# Patient Record
Sex: Male | Born: 1960 | Hispanic: No | Marital: Married | Smoking: Never smoker
Health system: Southern US, Community
[De-identification: ages and names within clinical notes are randomized; demographics above are authoritative.]

## PROBLEM LIST (undated history)

## (undated) DIAGNOSIS — T7840XA Allergy, unspecified, initial encounter: Secondary | ICD-10-CM

## (undated) DIAGNOSIS — E119 Type 2 diabetes mellitus without complications: Secondary | ICD-10-CM

## (undated) HISTORY — DX: Type 2 diabetes mellitus without complications: E11.9

## (undated) HISTORY — DX: Allergy, unspecified, initial encounter: T78.40XA

---

## 2003-09-05 ENCOUNTER — Ambulatory Visit (HOSPITAL_COMMUNITY): Admission: RE | Admit: 2003-09-05 | Discharge: 2003-09-05 | Payer: Self-pay | Admitting: Internal Medicine

## 2003-10-10 ENCOUNTER — Ambulatory Visit: Payer: Self-pay | Admitting: Nurse Practitioner

## 2003-10-18 ENCOUNTER — Ambulatory Visit: Payer: Self-pay | Admitting: Nurse Practitioner

## 2004-04-26 ENCOUNTER — Ambulatory Visit: Payer: Self-pay | Admitting: *Deleted

## 2004-04-26 ENCOUNTER — Ambulatory Visit: Payer: Self-pay | Admitting: Nurse Practitioner

## 2008-11-18 ENCOUNTER — Encounter: Admission: RE | Admit: 2008-11-18 | Discharge: 2008-11-18 | Payer: Self-pay | Admitting: Specialist

## 2011-06-26 ENCOUNTER — Other Ambulatory Visit: Payer: Self-pay | Admitting: Gastroenterology

## 2011-07-04 ENCOUNTER — Ambulatory Visit
Admission: RE | Admit: 2011-07-04 | Discharge: 2011-07-04 | Disposition: A | Discharge status: Home / Self Care | Payer: BC Managed Care – PPO | Source: Ambulatory Visit | Attending: Gastroenterology | Admitting: Gastroenterology

## 2012-06-08 IMAGING — US US ABDOMEN COMPLETE
1 series · 13 of 25 positions shown · non-contrast
Comparison: None.

CLINICAL DATA: Elevated liver function tests, diabetes

COMPLETE ABDOMINAL ULTRASOUND

[Series 1: us abdomen complete · 0.32mm/px · 13 of 81 slices shown]
[im 1/81]
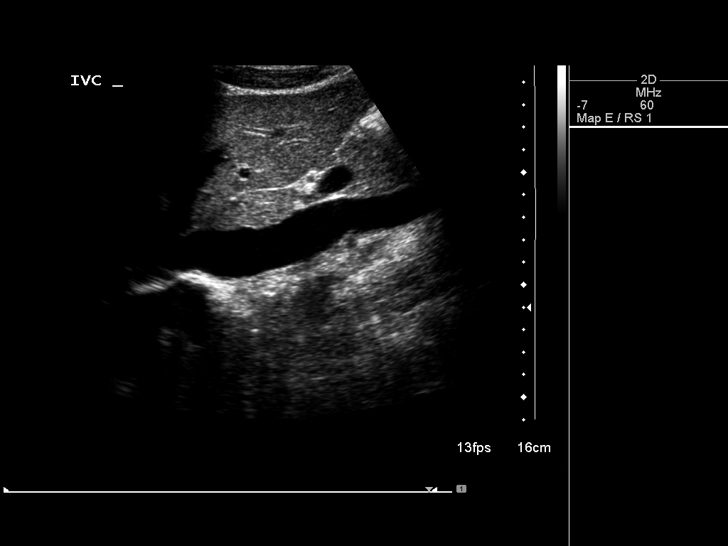
[im 7/81]
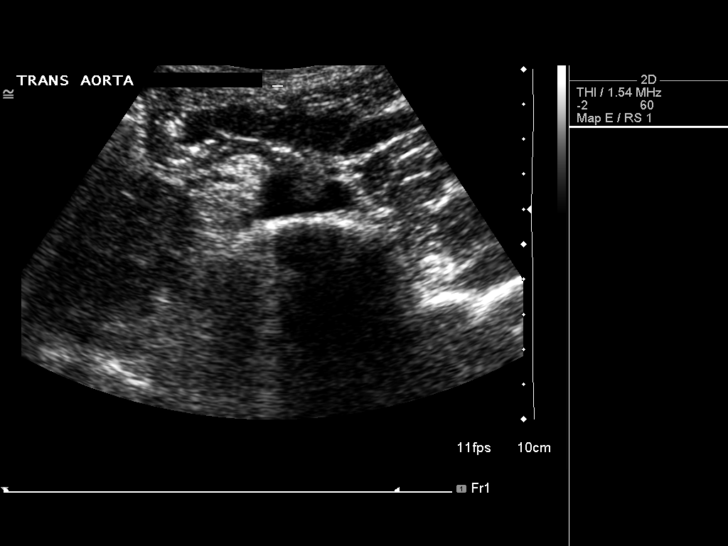
[im 14/81]
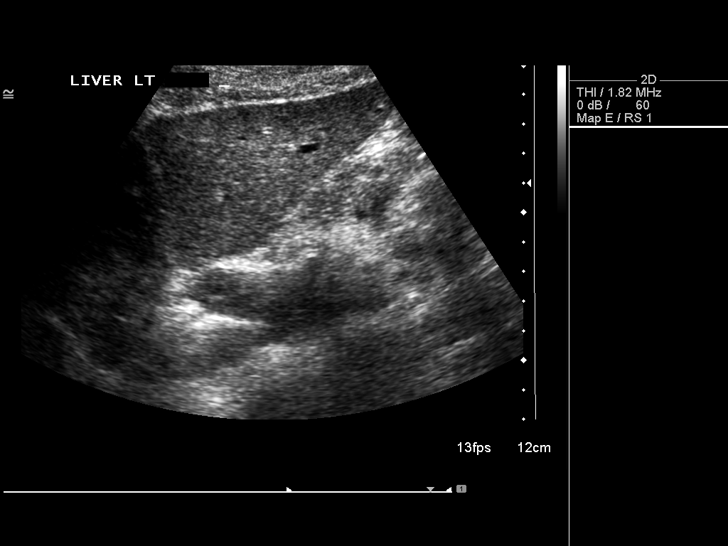
[im 21/81]
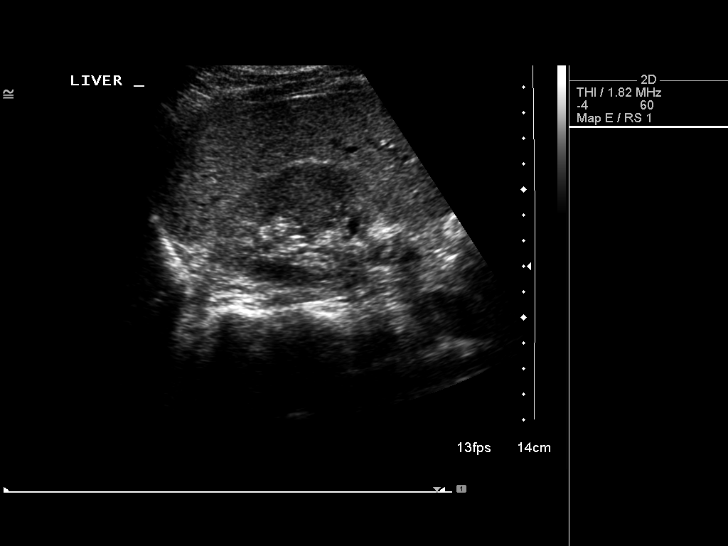
[im 27/81]
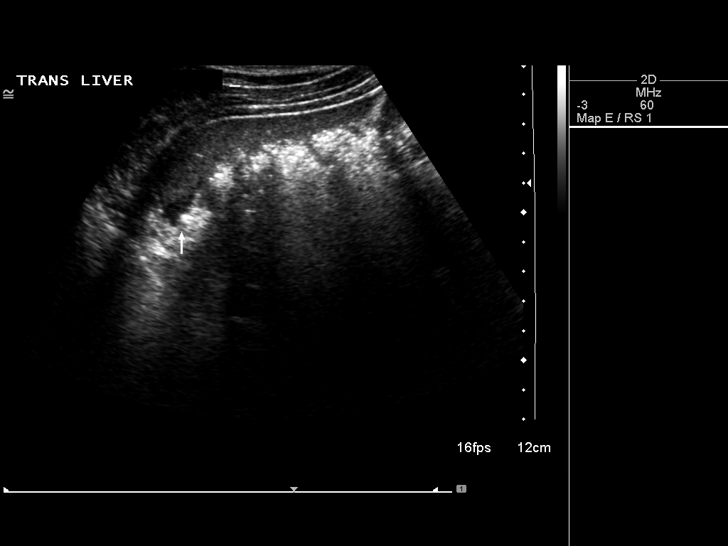
[im 34/81]
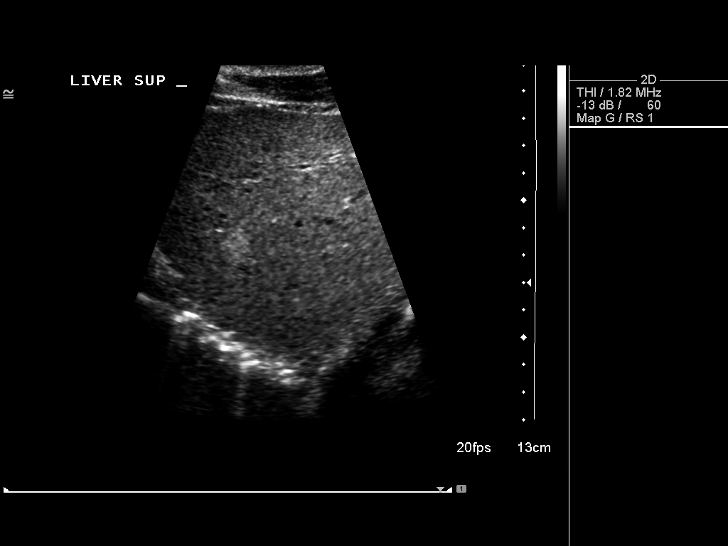
[im 41/81]
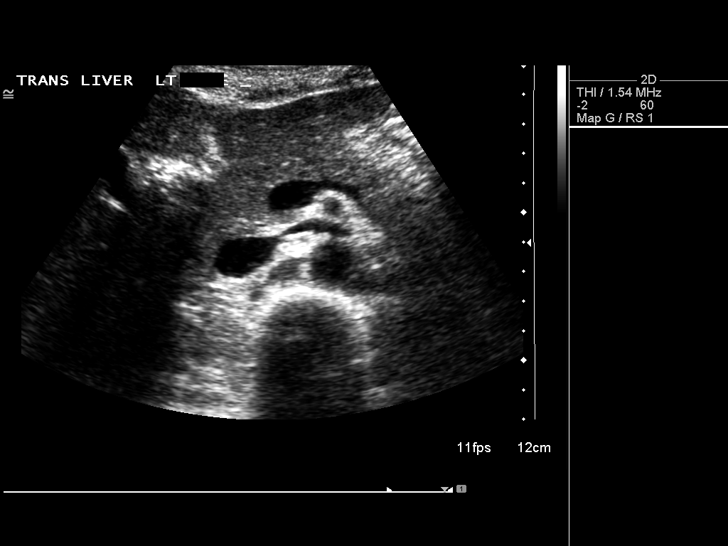
[im 47/81]
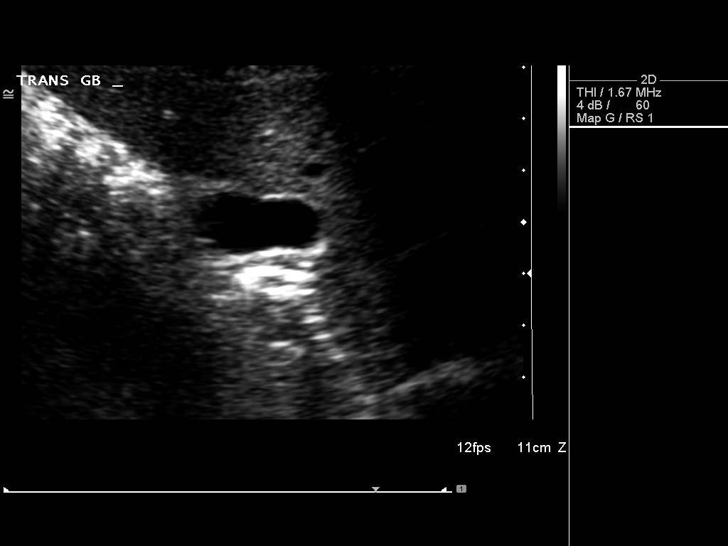
[im 54/81]
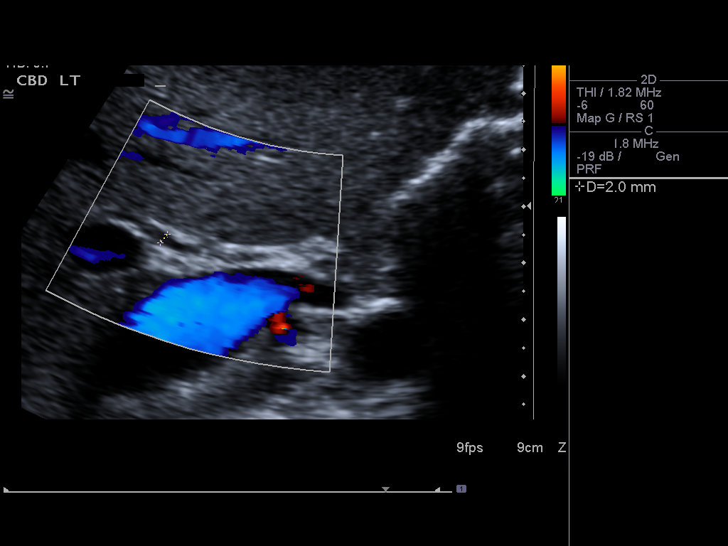
[im 61/81]
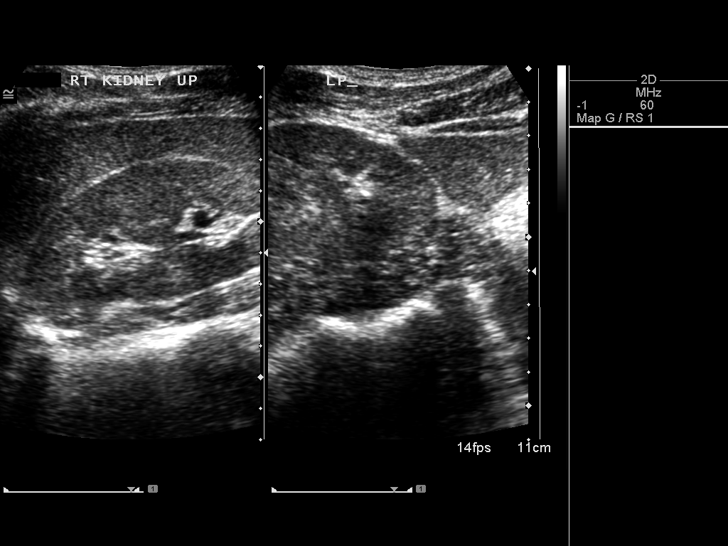
[im 67/81]
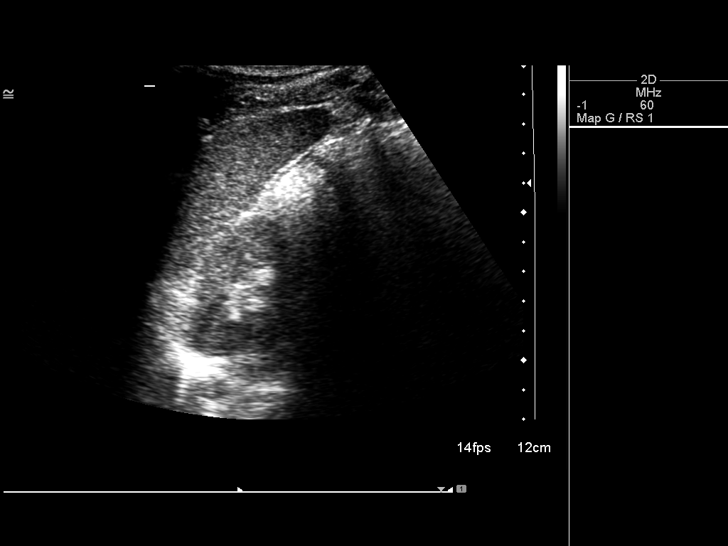
[im 74/81]
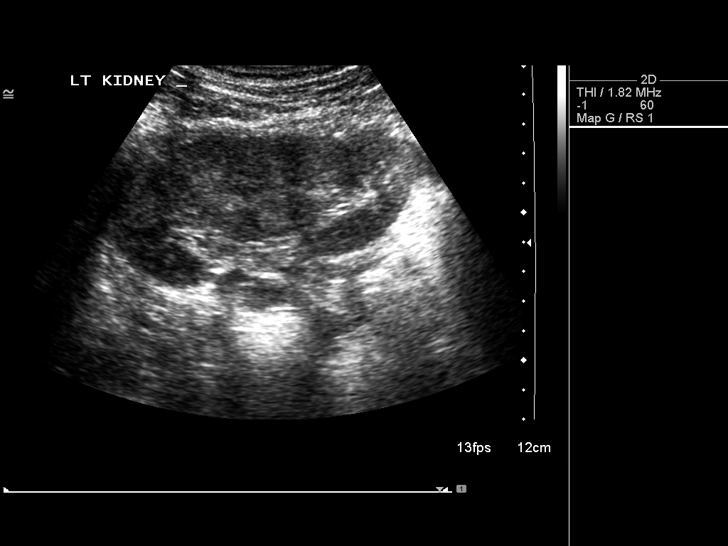
[im 81/81]
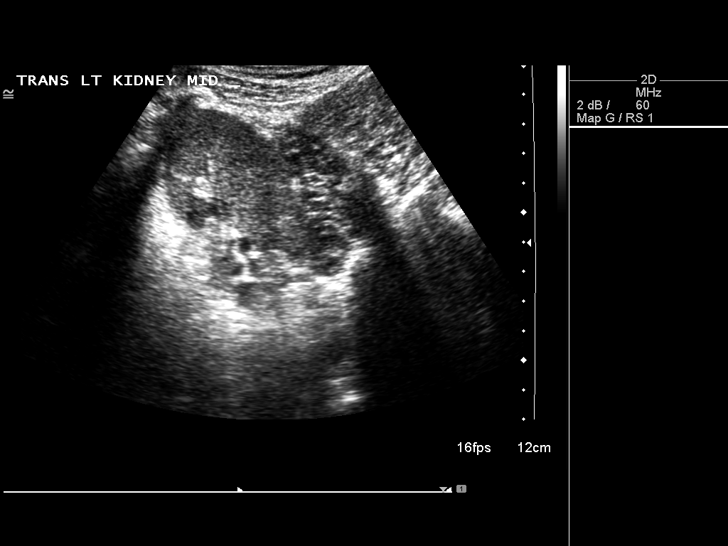

[13 of 25 positions shown; findings below may reference images not displayed]

FINDINGS: Gallbladder:  The gallbladder is visualized and no gallstones are
noted.  There is no pain over the gallbladder with compression.

Common bile duct:  The common bile duct is normal measuring 2.9 mm
in diameter.

Liver:  The liver has a normal echogenic pattern although there is
a small hyper echogenic focus in the right lobe base consistent
with a small hemangioma of 1.6 cm in maximum diameter.  No ductal
dilatation is seen.  There is a tiny amount of fluid along the
inferior aspect of the liver.

IVC:  Appears normal.

Pancreas:  No focal abnormality seen.

Spleen:  The spleen is normal measuring 8.7 cm sagittally.

Right Kidney:  No hydronephrosis is seen.  The right kidney
measures 9.6 cm sagittally.

Left Kidney:  No hydronephrosis is noted.  The left kidney measures
10.8 cm.

Abdominal aorta:  The abdominal aorta is normal in caliber with
atheromatous change present.
IMPRESSION: 1.  No gallstones.  No ductal dilatation.
2.  1.6 cm hemangioma in the right lobe of liver.
3.  Tiny amount of fluid adjacent to the inferior aspect of the
liver.

## 2013-02-26 ENCOUNTER — Ambulatory Visit: Payer: BC Managed Care – PPO

## 2013-05-17 ENCOUNTER — Ambulatory Visit: Payer: BC Managed Care – PPO | Admitting: Emergency Medicine

## 2013-05-17 VITALS — BP 144/90 | HR 60 | Temp 98.0°F | Resp 16 | Ht 63.5 in | Wt 122.2 lb

## 2013-05-17 DIAGNOSIS — J209 Acute bronchitis, unspecified: Secondary | ICD-10-CM

## 2013-05-17 DIAGNOSIS — J018 Other acute sinusitis: Secondary | ICD-10-CM

## 2013-05-17 MED ORDER — PROMETHAZINE-CODEINE 6.25-10 MG/5ML PO SYRP: 5.0000 mL | ORAL_SOLUTION | Freq: Four times a day (QID) | ORAL | Status: DC | PRN

## 2013-05-17 MED ORDER — PSEUDOEPHEDRINE-GUAIFENESIN ER 60-600 MG PO TB12: 1.0000 | ORAL_TABLET | Freq: Two times a day (BID) | ORAL | Status: DC

## 2013-05-17 MED ORDER — AMOXICILLIN-POT CLAVULANATE 875-125 MG PO TABS: 1.0000 | ORAL_TABLET | Freq: Two times a day (BID) | ORAL | Status: DC

## 2013-05-17 NOTE — Patient Instructions (Signed)

## 2013-05-17 NOTE — Progress Notes (Signed)
[facility] [address], [place] [address] [phone]  Date:  05/17/2013   Name:  Erik Grimes   DOB:  08-22-1960   MRN:  [id number]  PCP:  No PCP Per Patient    Chief Complaint: Cough and Allergies   History of Present Illness:  Erik Omsugustine Reigel is a 53 y.o. very pleasant male patient who presents with the following:  Ill for over a week with nasal congestion and mucopurulent drainage and a post nasal drip.  Has a sore throat.  Cough productive of mucopurulent sputum.  No fever or chills.  No wheezing or shortness of breath.  No nausea or vomiting.  No stool change or rash.  Has malaise and fatigue.  No improvement with over the counter medications or other home remedies. Denies other complaint or health concern today.   There are no active problems to display for this patient.   Past Medical History  Diagnosis Date  . Allergy   . Diabetes mellitus without complication     No past surgical history on file.  History  Substance Use Topics  . Smoking status: Never Smoker   . Smokeless tobacco: Not on file  . Alcohol Use: No    No family history on file.  No Known Allergies  Medication list has been reviewed and updated.  No current outpatient prescriptions on file prior to visit.   No current facility-administered medications on file prior to visit.    Review of Systems:  As per HPI, otherwise negative.    Physical Examination: Filed Vitals:   05/17/13 1221  BP: 144/90  Pulse: 60  Temp: 98 F (36.7 C)  Resp: 16   Filed Vitals:   05/17/13 1221  Height: 5' 3.5" (1.613 m)  Weight: 122 lb 3.2 oz (55.43 kg)   Body mass index is 21.3 kg/(m^2). Ideal Body Weight: Weight in (lb) to have BMI = 25: 143.1  GEN: WDWN, NAD, Non-toxic, A & O x 3 HEENT: Atraumatic, Normocephalic. Neck supple. No masses, No LAD. Ears and Nose: No external deformity. CV: RRR, No M/G/R. No JVD. No thrill. No extra heart sounds. PULM: CTA B, no  wheezes, crackles, rhonchi. No retractions. No resp. distress. No accessory muscle use. ABD: S, NT, ND, +BS. No rebound. No HSM. EXTR: No c/c/e NEURO Normal gait.  PSYCH: Normally interactive. Conversant. Not depressed or anxious appearing.  Calm demeanor.    Assessment and Plan: Sinusitis Bronchitis augmentin mucinex Phen c cod   Signed,  Phillips OdorJeffery Jeannelle Wiens, MD

## 2014-05-14 ENCOUNTER — Ambulatory Visit (INDEPENDENT_AMBULATORY_CARE_PROVIDER_SITE_OTHER): Payer: BLUE CROSS/BLUE SHIELD | Admitting: Physician Assistant

## 2014-05-14 VITALS — BP 118/70 | HR 70 | Temp 98.3°F | Resp 16 | Ht 63.0 in | Wt 118.2 lb

## 2014-05-14 DIAGNOSIS — R059 Cough, unspecified: Secondary | ICD-10-CM

## 2014-05-14 DIAGNOSIS — R05 Cough: Secondary | ICD-10-CM | POA: Diagnosis not present

## 2014-05-14 DIAGNOSIS — E119 Type 2 diabetes mellitus without complications: Secondary | ICD-10-CM | POA: Insufficient documentation

## 2014-05-14 DIAGNOSIS — I10 Essential (primary) hypertension: Secondary | ICD-10-CM | POA: Insufficient documentation

## 2014-05-14 DIAGNOSIS — J019 Acute sinusitis, unspecified: Secondary | ICD-10-CM

## 2014-05-14 DIAGNOSIS — E785 Hyperlipidemia, unspecified: Secondary | ICD-10-CM | POA: Insufficient documentation

## 2014-05-14 MED ORDER — FLUTICASONE PROPIONATE 50 MCG/ACT NA SUSP: 2.0000 | Freq: Every day | NASAL | Status: DC

## 2014-05-14 MED ORDER — AMOXICILLIN-POT CLAVULANATE 875-125 MG PO TABS: 1.0000 | ORAL_TABLET | Freq: Two times a day (BID) | ORAL | Status: AC

## 2014-05-14 MED ORDER — GUAIFENESIN ER 1200 MG PO TB12: 1.0000 | ORAL_TABLET | Freq: Two times a day (BID) | ORAL | Status: DC | PRN

## 2014-05-14 MED ORDER — HYDROCOD POLST-CHLORPHEN POLST 10-8 MG/5ML PO LQCR: 5.0000 mL | Freq: Two times a day (BID) | ORAL | Status: DC | PRN

## 2014-05-14 NOTE — Patient Instructions (Signed)
Ask the pharmacist for the other prescription from Dr. Concepcion ElkAvbuere. It's called cetirizine (Zyrtec). THat will help with the itching and sneezing and congestion.  Get plenty of rest and drink at least 64 ounces of water daily.

## 2014-05-14 NOTE — Progress Notes (Signed)
Patient ID: Erik Grimes, male    DOB: 01/03/1961, 54 y.o.   MRN: [id number]  PCP: Dorrene GermanAVBUERE,EDWIN A, MD  Subjective:   Chief Complaint  Patient presents with  . Cough  . sneezing  . Itchy Eyes  . Headache    HPI Presents for evaluation of cough, sneezing, headache. Accompanied by his wife.  Symptoms began last week with coughing, sneezing and headache. He contacted his PCP, who couldn't work him in, but sent in Chertussin and Cetirizine for his symptoms, as they were similar to what he had last spring, and those worked then. Chertussin isn't working this time. He isn't taking the Cetirizine.  With coughing, he has pain in the chest and back. Cough is productive of yellow sputum.  Nasal drainage is the same color. Chills first thing in the morning. Denies fever. Some nausea, loss of appetite. No vomiting. No diarrhea.  He was seen here 05/2013 for similar symptoms. He was diagnosed with sinusitis and bronchitis.   Review of Systems Review of Systems  Constitutional: Positive for chills. Negative for fever, diaphoresis and fatigue.  HENT: Positive for congestion, postnasal drip, rhinorrhea, sinus pressure and sneezing. Negative for ear pain, nosebleeds, sore throat and trouble swallowing.   Eyes: Positive for itching. Negative for pain.  Respiratory: Positive for cough. Negative for shortness of breath and wheezing.   Cardiovascular: Positive for chest pain (with coughing). Negative for palpitations.  Gastrointestinal: Positive for nausea. Negative for vomiting and diarrhea.  Musculoskeletal: Positive for back pain (with cough). Negative for myalgias and arthralgias.  Skin: Negative for rash.  Neurological: Positive for headaches. Negative for dizziness, weakness and light-headedness.       Patient Active Problem List   Diagnosis Date Noted  . Diabetes type 2, controlled 05/14/2014  . HTN (hypertension) 05/14/2014  . Hyperlipidemia 05/14/2014     Prior to  Admission medications   Medication Sig Start Date End Date Taking? Authorizing Provider  amLODipine (NORVASC) 5 MG tablet Take 5 mg by mouth daily.   Yes Historical Provider, MD  guaiFENesin-codeine (ROBITUSSIN AC) 100-10 MG/5ML syrup Take 10 mLs by mouth 2 (two) times daily.   Yes Historical Provider, MD  losartan (COZAAR) 100 MG tablet Take 100 mg by mouth daily.   Yes Historical Provider, MD  lovastatin (MEVACOR) 10 MG tablet Take 10 mg by mouth daily.   Yes Historical Provider, MD  metFORMIN (GLUCOPHAGE) 500 MG tablet Take by mouth 2 (two) times daily with a meal.   Yes Historical Provider, MD     No Known Allergies     Objective:  Physical Exam  Physical Exam  Constitutional: He is oriented to person, place, and time. He appears well-developed and well-nourished. No distress.  BP 118/70 mmHg  Pulse 70  Temp(Src) 98.3 F (36.8 C) (Oral)  Resp 16  Ht 5\' 3"  (1.6 m)  Wt 118 lb 4 oz (53.638 kg)  BMI 20.95 kg/m2  SpO2 99%   HENT:  Head: Normocephalic and atraumatic.  Right Ear: External ear normal.  Left Ear: External ear normal.  Nose: Nose normal.  Mouth/Throat: Oropharynx is clear and moist.  Eyes: Conjunctivae and EOM are normal. Pupils are equal, round, and reactive to light. No scleral icterus.  Neck: Neck supple. No thyromegaly present.  Cardiovascular: Normal rate, regular rhythm and normal heart sounds.   Pulmonary/Chest: Effort normal and breath sounds normal.  Lymphadenopathy:    He has no cervical adenopathy.  Neurological: He is alert and oriented to person, place, and  time.  Skin: Skin is warm and dry.  Psychiatric: He has a normal mood and affect. His speech is normal and behavior is normal.           Assessment & Plan:  1. Cough Likely due to post-nasal drainage. No evidence of bronchitis or pneumonia. - chlorpheniramine-HYDROcodone (TUSSIONEX PENNKINETIC ER) 10-8 MG/5ML LQCR; Take 5 mLs by mouth every 12 (twelve) hours as needed for cough (cough).   Dispense: 100 mL; Refill: 0  2. Subacute sinusitis, unspecified location Likely secondary to allergic rhinitis. Encouraged him to pick up the cetirizine prescription sent by his PCP. Rest, fluids. Follow-up with PCP if symptoms worsen/persist. - fluticasone (FLONASE) 50 MCG/ACT nasal spray; Place 2 sprays into both nostrils daily.  Dispense: 16 g; Refill: 12 - Guaifenesin (MUCINEX MAXIMUM STRENGTH) 1200 MG TB12; Take 1 tablet (1,200 mg total) by mouth every 12 (twelve) hours as needed.  Dispense: 14 tablet; Refill: 1 - amoxicillin-clavulanate (AUGMENTIN) 875-125 MG per tablet; Take 1 tablet by mouth 2 (two) times daily.  Dispense: 20 tablet; Refill: 0   Fernande Bras, PA-C Physician Assistant-Certified [facility]

## 2015-03-14 ENCOUNTER — Ambulatory Visit (INDEPENDENT_AMBULATORY_CARE_PROVIDER_SITE_OTHER): Payer: BLUE CROSS/BLUE SHIELD | Admitting: Family Medicine

## 2015-03-14 VITALS — BP 144/88 | HR 70 | Temp 98.4°F | Ht 63.0 in | Wt 120.0 lb

## 2015-03-14 DIAGNOSIS — R05 Cough: Secondary | ICD-10-CM | POA: Diagnosis not present

## 2015-03-14 DIAGNOSIS — J069 Acute upper respiratory infection, unspecified: Secondary | ICD-10-CM | POA: Diagnosis not present

## 2015-03-14 DIAGNOSIS — R059 Cough, unspecified: Secondary | ICD-10-CM

## 2015-03-14 MED ORDER — AMOXICILLIN 875 MG PO TABS: 875.0000 mg | ORAL_TABLET | Freq: Two times a day (BID) | ORAL | Status: DC

## 2015-03-14 MED ORDER — GUAIFENESIN-CODEINE 100-10 MG/5ML PO SYRP: 10.0000 mL | ORAL_SOLUTION | Freq: Every evening | ORAL | Status: DC | PRN

## 2015-03-14 NOTE — Patient Instructions (Signed)
Please take your allergy medication daily for the next 5-7 days (ceterizine) Please restart your flonase nasal spray and use for the next month (two sprays in each nostril at bedtime) Take tylenol, 2 tablets every 8-12 hours as needed for pain, fever Drink enough fluid that your urine is light yellow If you are not better in 4-5 days, you can start antibiotic (amoxicillin) Please come back to clinic if you get worse, run a fever over 101 for more than 24 hours or have any trouble breathing.

## 2015-03-14 NOTE — Progress Notes (Signed)
Subjective:     Patient ID: Erik Grimes, male   DOB: 10-Aug-1960, 55 y.o.   MRN: [id number]  HPI This is a pleasant 55 yo male who presents today with 2 days of cough, nasal congestion, sore throat. Yellow nasal drainage, feels feverish. Feels like it is difficult to breathe through his nose, thinks he hears wheezing. Tired and achy, decreased appetite. Has seasonal allergies, used flonase in the past, none recently.  Had flu shot last week while at PCP visit. He has paperwork with him today. HgA1C 5.7%. Does not check his blood sugars at home.    Past Medical History  Diagnosis Date  . Allergy   . Diabetes mellitus without complication (HCC)    No past surgical history on file. No family history on file. Social History  Substance Use Topics  . Smoking status: Never Smoker   . Smokeless tobacco: Never Used  . Alcohol Use: No    Review of Systems Per HPI    Objective:   Physical Exam  Constitutional: He is oriented to person, place, and time. He appears well-developed and well-nourished. No distress.  HENT:  Head: Normocephalic and atraumatic.  Right Ear: External ear and ear canal normal.  Left Ear: External ear and ear canal normal.  Nose: Mucosal edema and rhinorrhea present. Right sinus exhibits no maxillary sinus tenderness and no frontal sinus tenderness. Left sinus exhibits no maxillary sinus tenderness and no frontal sinus tenderness.  Mouth/Throat: Uvula is midline. No oropharyngeal exudate, posterior oropharyngeal edema or posterior oropharyngeal erythema.  Bilateral TMs opaque. Post nasal drainage.   Eyes: Conjunctivae are normal.  Neck: Normal range of motion. Neck supple.  Cardiovascular: Normal rate, regular rhythm and normal heart sounds.   Pulmonary/Chest: Effort normal and breath sounds normal.  Musculoskeletal: Normal range of motion.  Lymphadenopathy:    He has no cervical adenopathy.  Neurological: He is alert and oriented to person, place, and time.   Skin: Skin is warm and dry. He is not diaphoretic.  Psychiatric: He has a normal mood and affect. His behavior is normal. Judgment and thought content normal.  Vitals reviewed.     BP 144/88 mmHg  Pulse 70  Temp(Src) 98.4 F (36.9 C) (Oral)  Ht  (1.6 m)  Wt 120 lb (54.432 kg)  BMI 21.26 kg/m2  SpO2 99%     Assessment:      1. Upper respiratory infection - suspect viral uri, provided information regarding symptomatic treatment -  Patient Instructions  Please take your allergy medication daily for the next 5-7 days (ceterizine) Please restart your flonase nasal spray and use for the next month (two sprays in each nostril at bedtime) Take tylenol, 2 tablets every 8-12 hours as needed for pain, fever Drink enough fluid that your urine is light yellow If you are not better in 4-5 days, you can start antibiotic (amoxicillin) Please come back to clinic if you get worse, run a fever over 101 for more than 24 hours or have any trouble breathing.   -amoxicillin (AMOXIL) 875 MG tablet; Take 1 tablet (875 mg total) by mouth 2 (two) times daily.  Dispense: 14 tablet; Refill: 0  2. Cough - guaiFENesin-codeine (ROBITUSSIN AC) 100-10 MG/5ML syrup; Take 10 mLs by mouth at bedtime as needed for cough. Reported on 03/14/2015  Dispense: 118 mL; Refill: 0   Olean Ree, FNP-BC  [facility]  03/14/2015 10:17 AM  Plan:

## 2015-05-17 ENCOUNTER — Ambulatory Visit (INDEPENDENT_AMBULATORY_CARE_PROVIDER_SITE_OTHER): Payer: BLUE CROSS/BLUE SHIELD

## 2015-05-17 ENCOUNTER — Ambulatory Visit (INDEPENDENT_AMBULATORY_CARE_PROVIDER_SITE_OTHER): Payer: BLUE CROSS/BLUE SHIELD | Admitting: Urgent Care

## 2015-05-17 VITALS — BP 144/68 | HR 67 | Temp 98.2°F | Resp 16 | Ht 63.0 in | Wt 120.0 lb

## 2015-05-17 DIAGNOSIS — R05 Cough: Secondary | ICD-10-CM

## 2015-05-17 DIAGNOSIS — R0789 Other chest pain: Secondary | ICD-10-CM

## 2015-05-17 DIAGNOSIS — R0602 Shortness of breath: Secondary | ICD-10-CM | POA: Diagnosis not present

## 2015-05-17 DIAGNOSIS — J019 Acute sinusitis, unspecified: Secondary | ICD-10-CM

## 2015-05-17 DIAGNOSIS — R059 Cough, unspecified: Secondary | ICD-10-CM

## 2015-05-17 DIAGNOSIS — J302 Other seasonal allergic rhinitis: Secondary | ICD-10-CM

## 2015-05-17 LAB — POCT CBC
GRANULOCYTE PERCENT: 52.9 % (ref 37–80)
HEMATOCRIT: 42.6 % — AB (ref 43.5–53.7)
Hemoglobin: 14.8 g/dL (ref 14.1–18.1)
Lymph, poc: 1.9 (ref 0.6–3.4)
MCH: 32.2 pg — AB (ref 27–31.2)
MCHC: 34.6 g/dL (ref 31.8–35.4)
MCV: 92.9 fL (ref 80–97)
MID (CBC): 0.5 (ref 0–0.9)
MPV: 6.2 fL (ref 0–99.8)
POC GRANULOCYTE: 2.7 (ref 2–6.9)
POC LYMPH %: 37.6 % (ref 10–50)
POC MID %: 9.5 %M (ref 0–12)
Platelet Count, POC: 151 10*3/uL (ref 142–424)
RBC: 4.59 M/uL — AB (ref 4.69–6.13)
RDW, POC: 13 %
WBC: 5.1 10*3/uL (ref 4.6–10.2)

## 2015-05-17 MED ORDER — AMOXICILLIN-POT CLAVULANATE 875-125 MG PO TABS: 1.0000 | ORAL_TABLET | Freq: Two times a day (BID) | ORAL | Status: DC

## 2015-05-17 MED ORDER — PSEUDOEPHEDRINE HCL ER 120 MG PO TB12: 120.0000 mg | ORAL_TABLET | Freq: Two times a day (BID) | ORAL | Status: DC

## 2015-05-17 MED ORDER — HYDROCOD POLST-CPM POLST ER 10-8 MG/5ML PO SUER: 5.0000 mL | Freq: Every evening | ORAL | Status: AC | PRN

## 2015-05-17 MED ORDER — MONTELUKAST SODIUM 10 MG PO TABS: 10.0000 mg | ORAL_TABLET | Freq: Every day | ORAL | Status: AC

## 2015-05-17 NOTE — Patient Instructions (Addendum)
Allergies An allergy is an abnormal reaction to a substance by the body's defense system (immune system). Allergies can develop at any age. WHAT CAUSES ALLERGIES? An allergic reaction happens when the immune system mistakenly reacts to a normally harmless substance, called an allergen, as if it were harmful. The immune system releases antibodies to fight the substance. Antibodies eventually release a chemical called histamine into the bloodstream. The release of histamine is meant to protect the body from infection, but it also causes discomfort. An allergic reaction can be triggered by:  Eating an allergen.  Inhaling an allergen.  Touching an allergen. WHAT TYPES OF ALLERGIES ARE THERE? There are many types of allergies. Common types include:  Seasonal allergies. People with this type of allergy are usually allergic to substances that are only present during certain seasons, such as molds and pollens.  Food allergies.  Drug allergies.  Insect allergies.  Animal dander allergies. WHAT ARE SYMPTOMS OF ALLERGIES? Possible allergy symptoms include:  Swelling of the lips, face, tongue, mouth, or throat.  Sneezing, coughing, or wheezing.  Nasal congestion.  Tingling in the mouth.  Rash.  Itching.  Itchy, red, swollen areas of skin (hives).  Watery eyes.  Vomiting.  Diarrhea.  Dizziness.  Lightheadedness.  Fainting.  Trouble breathing or swallowing.  Chest tightness.  Rapid heartbeat. HOW ARE ALLERGIES DIAGNOSED? Allergies are diagnosed with a medical and family history and one or more of the following:  Skin tests.  Blood tests.  A food diary. A food diary is a record of all the foods and drinks you have in a day and of all the symptoms you experience.  The results of an elimination diet. An elimination diet involves eliminating foods from your diet and then adding them back in one by one to find out if a certain food causes an allergic reaction. HOW ARE  ALLERGIES TREATED? There is no cure for allergies, but allergic reactions can be treated with medicine. Severe reactions usually need to be treated at a hospital. HOW CAN REACTIONS BE PREVENTED? The best way to prevent an allergic reaction is by avoiding the substance you are allergic to. Allergy shots and medicines can also help prevent reactions in some cases. People with severe allergic reactions may be able to prevent a life-threatening reaction called anaphylaxis with a medicine given right after exposure to the allergen.   This information is not intended to replace advice given to you by your health care provider. Make sure you discuss any questions you have with your health care provider.   Document Released: 04/15/2002 Document Revised: 02/10/2014 Document Reviewed: 11/01/2013 Elsevier Interactive Patient Education 2016 Elsevier Inc.    Sinusitis, Adult Sinusitis is redness, soreness, and inflammation of the paranasal sinuses. Paranasal sinuses are air pockets within the bones of your face. They are located beneath your eyes, in the middle of your forehead, and above your eyes. In healthy paranasal sinuses, mucus is able to drain out, and air is able to circulate through them by way of your nose. However, when your paranasal sinuses are inflamed, mucus and air can become trapped. This can allow bacteria and other germs to grow and cause infection. Sinusitis can develop quickly and last only a short time (acute) or continue over a long period (chronic). Sinusitis that lasts for more than 12 weeks is considered chronic. CAUSES Causes of sinusitis include:  Allergies.  Structural abnormalities, such as displacement of the cartilage that separates your nostrils (deviated septum), which can decrease the air flow  through your nose and sinuses and affect sinus drainage.  Functional abnormalities, such as when the small hairs (cilia) that line your sinuses and help remove mucus do not work  properly or are not present. SIGNS AND SYMPTOMS Symptoms of acute and chronic sinusitis are the same. The primary symptoms are pain and pressure around the affected sinuses. Other symptoms include:  Upper toothache.  Earache.  Headache.  Bad breath.  Decreased sense of smell and taste.  A cough, which worsens when you are lying flat.  Fatigue.  Fever.  Thick drainage from your nose, which often is green and may contain pus (purulent).  Swelling and warmth over the affected sinuses. DIAGNOSIS Your health care provider will perform a physical exam. During your exam, your health care provider may perform any of the following to help determine if you have acute sinusitis or chronic sinusitis:  Look in your nose for signs of abnormal growths in your nostrils (nasal polyps).  Tap over the affected sinus to check for signs of infection.  View the inside of your sinuses using an imaging device that has a light attached (endoscope). If your health care provider suspects that you have chronic sinusitis, one or more of the following tests may be recommended:  Allergy tests.  Nasal culture. A sample of mucus is taken from your nose, sent to a lab, and screened for bacteria.  Nasal cytology. A sample of mucus is taken from your nose and examined by your health care provider to determine if your sinusitis is related to an allergy. TREATMENT Most cases of acute sinusitis are related to a viral infection and will resolve on their own within 10 days. Sometimes, medicines are prescribed to help relieve symptoms of both acute and chronic sinusitis. These may include pain medicines, decongestants, nasal steroid sprays, or saline sprays. However, for sinusitis related to a bacterial infection, your health care provider will prescribe antibiotic medicines. These are medicines that will help kill the bacteria causing the infection. Rarely, sinusitis is caused by a fungal infection. In these cases,  your health care provider will prescribe antifungal medicine. For some cases of chronic sinusitis, surgery is needed. Generally, these are cases in which sinusitis recurs more than 3 times per year, despite other treatments. HOME CARE INSTRUCTIONS  Drink plenty of water. Water helps thin the mucus so your sinuses can drain more easily.  Use a humidifier.  Inhale steam 3-4 times a day (for example, sit in the bathroom with the shower running).  Apply a warm, moist washcloth to your face 3-4 times a day, or as directed by your health care provider.  Use saline nasal sprays to help moisten and clean your sinuses.  Take medicines only as directed by your health care provider.  If you were prescribed either an antibiotic or antifungal medicine, finish it all even if you start to feel better. SEEK IMMEDIATE MEDICAL CARE IF:  You have increasing pain or severe headaches.  You have nausea, vomiting, or drowsiness.  You have swelling around your face.  You have vision problems.  You have a stiff neck.  You have difficulty breathing.   This information is not intended to replace advice given to you by your health care provider. Make sure you discuss any questions you have with your health care provider.   Document Released: 01/20/2005 Document Revised: 02/10/2014 Document Reviewed: 02/04/2011 Elsevier Interactive Patient Education 2016 Elsevier Inc.     Cough, Adult Coughing is a reflex that clears your throat  and your airways. Coughing helps to heal and protect your lungs. It is normal to cough occasionally, but a cough that happens with other symptoms or lasts a long time may be a sign of a condition that needs treatment. A cough may last only 2-3 weeks (acute), or it may last longer than 8 weeks (chronic). CAUSES Coughing is commonly caused by:  Breathing in substances that irritate your lungs.  A viral or bacterial respiratory infection.  Allergies.  Asthma.  Postnasal  drip.  Smoking.  Acid backing up from the stomach into the esophagus (gastroesophageal reflux).  Certain medicines.  Chronic lung problems, including COPD (or rarely, lung cancer).  Other medical conditions such as heart failure. HOME CARE INSTRUCTIONS  Pay attention to any changes in your symptoms. Take these actions to help with your discomfort:  Take medicines only as told by your health care provider.  If you were prescribed an antibiotic medicine, take it as told by your health care provider. Do not stop taking the antibiotic even if you start to feel better.  Talk with your health care provider before you take a cough suppressant medicine.  Drink enough fluid to keep your urine clear or pale yellow.  If the air is dry, use a cold steam vaporizer or humidifier in your bedroom or your home to help loosen secretions.  Avoid anything that causes you to cough at work or at home.  If your cough is worse at night, try sleeping in a semi-upright position.  Avoid cigarette smoke. If you smoke, quit smoking. If you need help quitting, ask your health care provider.  Avoid caffeine.  Avoid alcohol.  Rest as needed. SEEK MEDICAL CARE IF:   You have new symptoms.  You cough up pus.  Your cough does not get better after 2-3 weeks, or your cough gets worse.  You cannot control your cough with suppressant medicines and you are losing sleep.  You develop pain that is getting worse or pain that is not controlled with pain medicines.  You have a fever.  You have unexplained weight loss.  You have night sweats. SEEK IMMEDIATE MEDICAL CARE IF:  You cough up blood.  You have difficulty breathing.  Your heartbeat is very fast.   This information is not intended to replace advice given to you by your health care provider. Make sure you discuss any questions you have with your health care provider.   Document Released: 07/19/2010 Document Revised: 10/11/2014 Document  Reviewed: 03/29/2014 Elsevier Interactive Patient Education 2016 [employer].   IF you received an x-ray today, you will receive an invoice from [facility]. Please contact [facility] at [phone] with questions or concerns regarding your invoice.   IF you received labwork today, you will receive an invoice from [employer]. Please contact [facility] at [phone] with questions or concerns regarding your invoice.   Our billing staff will not be able to assist you with questions regarding bills from these companies.  You will be contacted with the lab results as soon as they are available. The fastest way to get your results is to activate your My Chart account. Instructions are located on the last page of this paperwork. If you have not heard from [place] regarding the results in 2 weeks, please contact this office.

## 2015-05-17 NOTE — Progress Notes (Signed)
MRN: [id number] DOB: [birth year]  Subjective:   Erik Grimes is a 55 y.o. male presenting for chief complaint of Allergies  Reports 4 day history of difficulty with his allergies. Has had a productive cough, worse at night, cough elicits chest pain and shob. Also has subjective fever, stuffy nose, itchy eyes, sinus headaches, scratchy throat. Has tried Zyrtec and Tylenol with minimal help. Denies history of asthma. Denies smoking cigarettes.  Erik Grimes has a current medication list which includes the following prescription(s): amlodipine, cetirizine, losartan, lovastatin, metformin, and amoxicillin. Also has No Known Allergies.  Erik Grimes  has a past medical history of Allergy and Diabetes mellitus without complication (HCC). Also  has no past surgical history on file.  Objective:   Vitals: BP 144/68 mmHg  Pulse 67  Temp(Src) 98.2 F (36.8 C)  Resp 16  Ht  (1.6 m)  Wt 120 lb (54.432 kg)  BMI 21.26 kg/m2  SpO2 98%  Physical Exam  Constitutional: He is oriented to person, place, and time. He appears well-developed and well-nourished.  HENT:  TM's flat bilaterally, no effusions or erythema. Nasal turbinates erythematous, edematous, nasal passages minimally patent with bilateral maxillary sinus tenderness. Throat without oropharyngeal exudates, erythema or abscesses.  Eyes: Right eye exhibits no discharge. Left eye exhibits no discharge. No scleral icterus.  Neck: Normal range of motion. Neck supple.  Cardiovascular: Normal rate, regular rhythm and intact distal pulses.  Exam reveals no gallop and no friction rub.   No murmur heard. Pulmonary/Chest: No respiratory distress. He has no wheezes. He has no rales.  Decreased lung sounds.  Lymphadenopathy:    He has no cervical adenopathy.  Neurological: He is alert and oriented to person, place, and time.  Skin: Skin is warm and dry.   Dg Chest 2 View  05/17/2015  CLINICAL DATA:  Productive cough. EXAM: CHEST  2 VIEW  COMPARISON:  No prior. FINDINGS: The heart size and mediastinal contours are within normal limits. Both lungs are clear. The visualized skeletal structures are unremarkable. IMPRESSION: No active cardiopulmonary disease. Electronically Signed   By: Maisie Fus  Register   On: 05/17/2015 09:39    Results for orders placed or performed in visit on 05/17/15 (from the past 24 hour(s))  POCT CBC     Status: Abnormal   Collection Time: 05/17/15  9:33 AM  Result Value Ref Range   WBC 5.1 4.6 - 10.2 K/uL   Lymph, poc 1.9 0.6 - 3.4   POC LYMPH PERCENT 37.6 10 - 50 %L   MID (cbc) 0.5 0 - 0.9   POC MID % 9.5 0 - 12 %M   POC Granulocyte 2.7 2 - 6.9   Granulocyte percent 52.9 37 - 80 %G   RBC 4.59 (A) 4.69 - 6.13 M/uL   Hemoglobin 14.8 14.1 - 18.1 g/dL   HCT, POC 29.5 (A) 28.4 - 53.7 %   MCV 92.9 80 - 97 fL   MCH, POC 32.2 (A) 27 - 31.2 pg   MCHC 34.6 31.8 - 35.4 g/dL   RDW, POC 13.2 %   Platelet Count, POC 151 142 - 424 K/uL   MPV 6.2 0 - 99.8 fL   Assessment and Plan :   1. Cough 2. Atypical chest pain 3. Shortness of breath 4. Seasonal allergies 5. Acute sinusitis, recurrence not specified, unspecified location - Radiology report and cbc reassuring. Patient will take Augmentin to cover for recurrent sinusitis secondary to severe allergies. He is to continue Zyrtec daily, add Sudafed and  nasal saline flushes. Start Singulair. Consider referral to allergist if symptoms persist.  Erik BambergMario Yuvonne Lanahan, PA-C [facility] [phone] 05/17/2015 9:06 AM

## 2015-08-28 DIAGNOSIS — E784 Other hyperlipidemia: Secondary | ICD-10-CM | POA: Diagnosis not present

## 2015-08-28 DIAGNOSIS — E119 Type 2 diabetes mellitus without complications: Secondary | ICD-10-CM | POA: Diagnosis not present

## 2015-08-28 DIAGNOSIS — K219 Gastro-esophageal reflux disease without esophagitis: Secondary | ICD-10-CM | POA: Diagnosis not present

## 2015-08-28 DIAGNOSIS — I10 Essential (primary) hypertension: Secondary | ICD-10-CM | POA: Diagnosis not present

## 2015-09-24 ENCOUNTER — Encounter: Payer: Self-pay | Admitting: Physician Assistant

## 2015-09-24 ENCOUNTER — Ambulatory Visit (INDEPENDENT_AMBULATORY_CARE_PROVIDER_SITE_OTHER): Payer: BLUE CROSS/BLUE SHIELD | Admitting: Physician Assistant

## 2015-09-24 VITALS — BP 130/86 | HR 70 | Temp 98.2°F | Resp 17 | Ht 63.0 in | Wt 119.0 lb

## 2015-09-24 DIAGNOSIS — L02419 Cutaneous abscess of limb, unspecified: Secondary | ICD-10-CM

## 2015-09-24 DIAGNOSIS — L0889 Other specified local infections of the skin and subcutaneous tissue: Secondary | ICD-10-CM | POA: Diagnosis not present

## 2015-09-24 MED ORDER — SULFAMETHOXAZOLE-TRIMETHOPRIM 800-160 MG PO TABS: 1.0000 | ORAL_TABLET | Freq: Two times a day (BID) | ORAL | 0 refills | Status: DC

## 2015-09-24 NOTE — Patient Instructions (Addendum)
Make sure you are keeping it covered and after 24 hours change the DRESSING while keeping the packing in place, after 48 hours you can removed the PACKING and then I would like you to wash it with soap and water twice a day after the 48 hours. You can use antbacterial soap, but do not use highly frangrented soap. You will keep it covered until there it scabbing over it. I am going to give you an antibiotic and you will take it until completion.    IF you received an x-ray today, you will receive an invoice from [facility]. Please contact [facility] at [phone] with questions or concerns regarding your invoice.   IF you received labwork today, you will receive an invoice from [facility]. Please contact [facility] at [phone] with questions or concerns regarding your invoice.   Our billing staff will not be able to assist you with questions regarding bills from these companies.  You will be contacted with the lab results as soon as they are available. The fastest way to get your results is to activate your My Chart account. Instructions are located on the last page of this paperwork. If you have not heard from us regarding the results in 2 weeks, please contact this office.

## 2015-09-24 NOTE — Progress Notes (Signed)
By signing my name below, I, Erik Grimes, attest that this documentation has been prepared under the direction and in the presence of Treatment Team:  Attending Provider: Ethelda ChickKristi M Smith, MD Physician Assistant: Erik BuddyStephanie D English, PA.  Electronically Signed: Arvilla MarketMesha Grimes, Medical Scribe. 09/24/15. 1:49 PM.  Subjective:    Patient ID: Erik Grimes, male    DOB: [birth year], 55 y.o.   MRN: [id number]  HPI Chief Complaint  Patient presents with   Recurrent Skin Infections    under right armpit, since wednesday     HPI Comments: Erik Grimes is a 55 y.o. male who [facility] complaining of worsening boil under his right axilla onset 5 days ago. Pt burst it 2 days ago with blood and purulent drainage. Pt has had a boil under his left arm in the past, and he shaves both of his armpits. Pt denies fever.  Patient Active Problem List   Diagnosis Date Noted   Diabetes type 2, controlled (HCC) 05/14/2014   HTN (hypertension) 05/14/2014   Hyperlipidemia 05/14/2014   Past Medical History:  Diagnosis Date   Allergy    Diabetes mellitus without complication (HCC)    No past surgical history on file. No Known Allergies Prior to Admission medications   Medication Sig Start Date End Date Taking? Authorizing Provider  amLODipine (NORVASC) 5 MG tablet Take 5 mg by mouth daily.    Historical Provider, MD  amoxicillin-clavulanate (AUGMENTIN) 875-125 MG tablet Take 1 tablet by mouth 2 (two) times daily. 05/17/15   Wallis BambergMario Mani, PA-C  cetirizine (ZYRTEC) 10 MG tablet Take 10 mg by mouth daily as needed. 02/10/15   Historical Provider, MD  chlorpheniramine-HYDROcodone (TUSSIONEX PENNKINETIC ER) 10-8 MG/5ML SUER Take 5 mLs by mouth at bedtime as needed for cough. 05/17/15   Wallis BambergMario Mani, PA-C  losartan (COZAAR) 100 MG tablet Take 100 mg by mouth daily.    Historical Provider, MD  lovastatin (MEVACOR) 10 MG tablet Take 10 mg by mouth daily.     Historical Provider, MD  metFORMIN (GLUCOPHAGE) 500 MG tablet Take by mouth 2 (two) times daily with a meal.    Historical Provider, MD  montelukast (SINGULAIR) 10 MG tablet Take 1 tablet (10 mg total) by mouth at bedtime. 05/17/15   Wallis BambergMario Mani, PA-C  pseudoephedrine (SUDAFED 12 HOUR) 120 MG 12 hr tablet Take 1 tablet (120 mg total) by mouth 2 (two) times daily. 05/17/15   Wallis BambergMario Mani, PA-C   Social History   Social History   Marital status: Married    Spouse name: N/A   Number of children: N/A   Years of education: N/A   Occupational History   Not on file.   Social History Main Topics   Smoking status: Never Smoker   Smokeless tobacco: Never Used   Alcohol use No   Drug use: No   Sexual activity: Not on file   Other Topics Concern   Not on file   Social History Narrative   Originally from [place].   Came to the [place] in 2002.   Lives with his wife, also from [place], and their 4 children.   [facility] 09/24/2015 05/17/2015 03/14/2015  Decreased Interest 0 0 0  Down, Depressed, Hopeless 0 0 0  PHQ - 2 Score 0 0 0   Review of Systems  Constitutional: Negative for fever.  Skin: Positive for wound.   Objective: BP 130/86    Pulse 70    Temp 98.2  F (36.8 C) (Oral)    Resp 17    Ht 5\' 3"  (1.6 m)    Wt 119 lb (54 kg)    SpO2 99%    BMI 21.08 kg/m   Physical Exam  Constitutional: He appears well-developed and well-nourished. No distress.  HENT:  Head: Normocephalic and atraumatic.  Eyes: Conjunctivae are normal.  Neck: Neck supple.  Cardiovascular: Normal rate.   Pulmonary/Chest: Effort normal.  Lymphadenopathy:    He has no cervical adenopathy.    He has no axillary adenopathy.  Neurological: He is alert.  Skin: Skin is warm and dry.  Nodular swollen at the right axillary with some minor induration around the area Upon palpation purulent fluid along with sanguineous fluid expressed  Psychiatric: He has a normal mood and affect. His behavior is normal.    Nursing note and vitals reviewed. Procedure: Verbal consent obtained. 1% lidocaine placed at the wound site. Cleansed with povidone-iodine. A 1 cm incision was placed at the wound site at the right armpit. Exudate generously expressed cleansed with normal saline. Dressings applied. Assessment & Plan:  55 year old male patient is here today with a bump under her arm. Very minimal packing was placed today. Advised to remove in 48 hours.  Wound care discussed. Shower when  No deodorant under right axilla No swimming Bactrim bid FOR 10 DAYS rtc as needed.  Axillary abscess - Plan: WOUND CULTURE  Other specified local infections of the skin and subcutaneous tissue - Plan: sulfamethoxazole-trimethoprim (BACTRIM DS,SEPTRA DS) 800-160 MG tablet, WOUND CULTURE  Erik PlattStephanie English, PA-C [facility] 8/23/20177:41 AM  I personally performed the services described in this documentation, which was scribed in my presence. The recorded information has been reviewed and is accurate.

## 2015-09-27 LAB — WOUND CULTURE
GRAM STAIN: NONE SEEN
GRAM STAIN: NONE SEEN

## 2016-02-26 DIAGNOSIS — J302 Other seasonal allergic rhinitis: Secondary | ICD-10-CM | POA: Diagnosis not present

## 2016-02-26 DIAGNOSIS — I10 Essential (primary) hypertension: Secondary | ICD-10-CM | POA: Diagnosis not present

## 2016-02-26 DIAGNOSIS — Z125 Encounter for screening for malignant neoplasm of prostate: Secondary | ICD-10-CM | POA: Diagnosis not present

## 2016-02-26 DIAGNOSIS — E784 Other hyperlipidemia: Secondary | ICD-10-CM | POA: Diagnosis not present

## 2016-02-26 DIAGNOSIS — E119 Type 2 diabetes mellitus without complications: Secondary | ICD-10-CM | POA: Diagnosis not present

## 2016-04-21 IMAGING — CR DG CHEST 2V
2 series · 2 of 2 positions shown · non-contrast
Comparison: No prior.

CLINICAL DATA: Productive cough.

EXAM:
CHEST  2 VIEW

[PA]
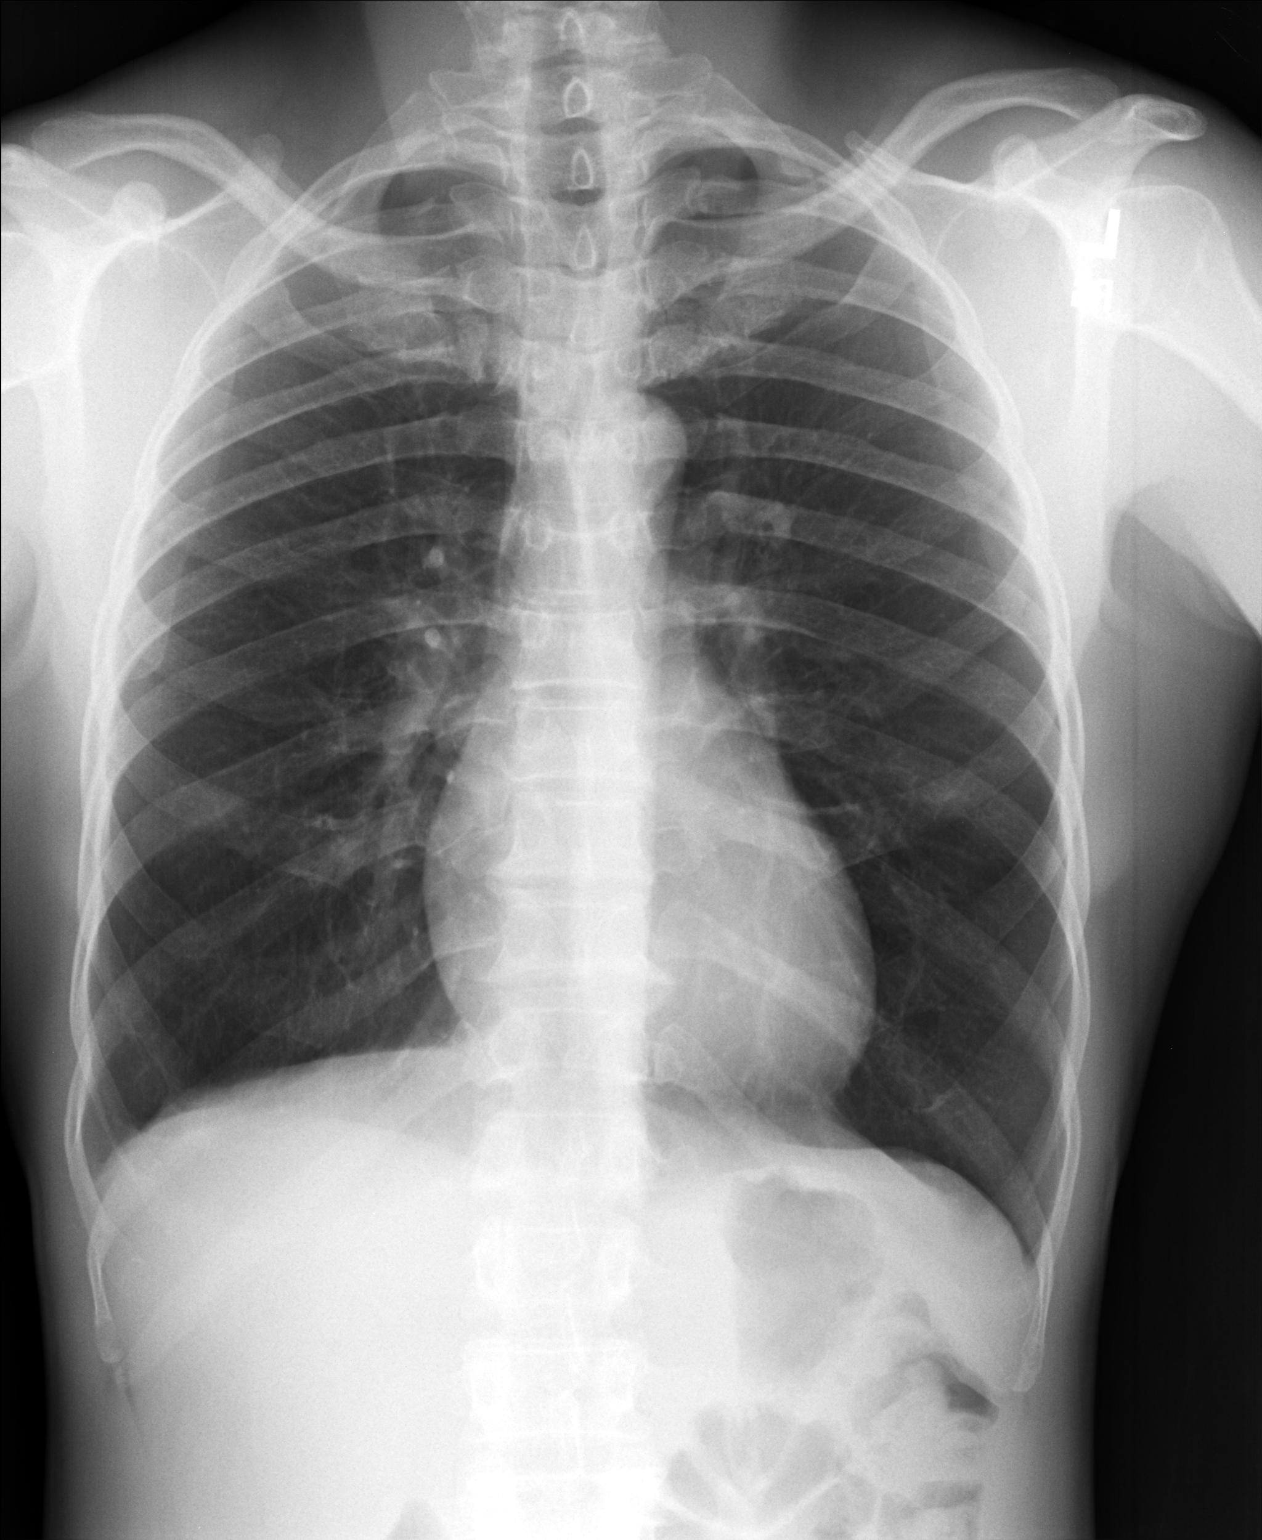

[lateral]
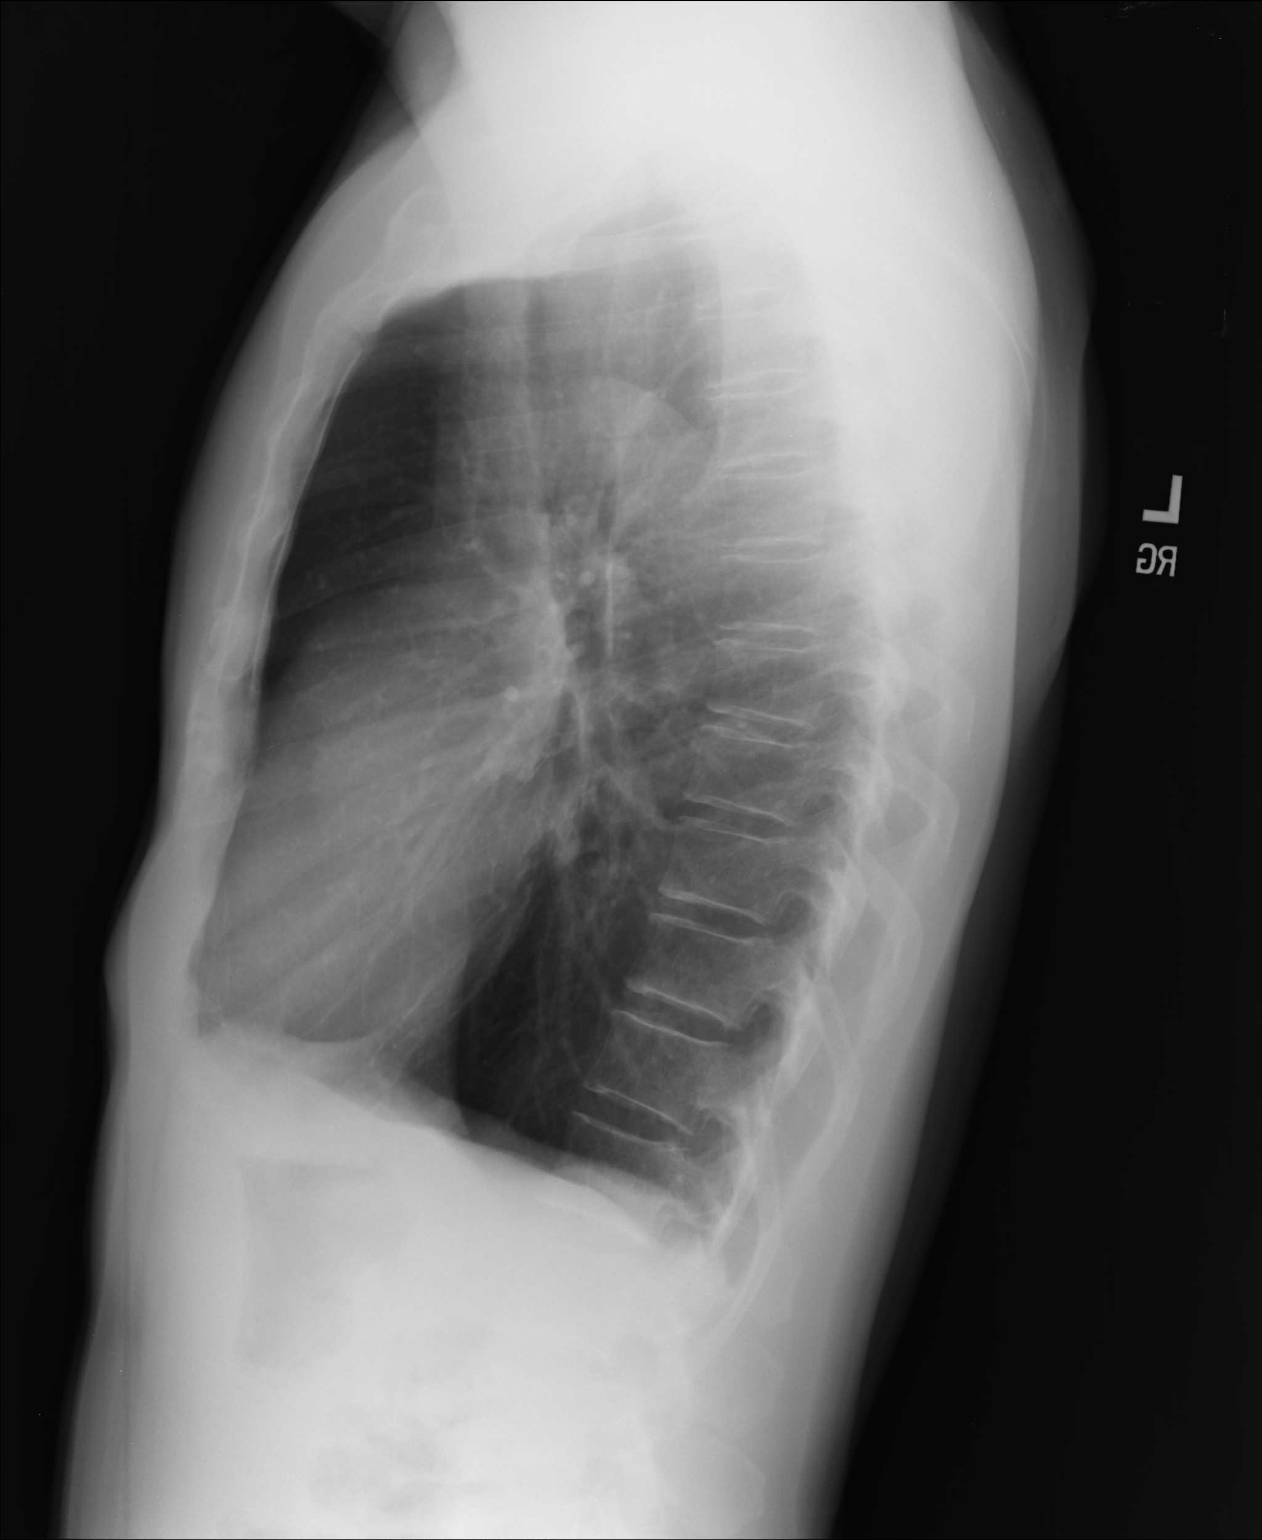

[2 of 2 positions shown; findings below may reference images not displayed]

FINDINGS: The heart size and mediastinal contours are within normal limits.
Both lungs are clear. The visualized skeletal structures are
unremarkable.
IMPRESSION: No active cardiopulmonary disease.

## 2016-05-21 ENCOUNTER — Ambulatory Visit (INDEPENDENT_AMBULATORY_CARE_PROVIDER_SITE_OTHER): Payer: BLUE CROSS/BLUE SHIELD | Admitting: Physician Assistant

## 2016-05-21 VITALS — BP 156/90 | HR 67 | Temp 97.8°F | Resp 16 | Ht 63.0 in | Wt 120.0 lb

## 2016-05-21 DIAGNOSIS — J301 Allergic rhinitis due to pollen: Secondary | ICD-10-CM | POA: Diagnosis not present

## 2016-05-21 LAB — POCT GLYCOSYLATED HEMOGLOBIN (HGB A1C): HEMOGLOBIN A1C: 6.1

## 2016-05-21 MED ORDER — METHYLPREDNISOLONE ACETATE 80 MG/ML IJ SUSP
80.0000 mg | Freq: Once | INTRAMUSCULAR | Status: AC
  Administered 2016-05-21: 80 mg via INTRAMUSCULAR

## 2016-05-21 MED ORDER — LEVOCETIRIZINE DIHYDROCHLORIDE 5 MG PO TABS: 5.0000 mg | ORAL_TABLET | Freq: Every evening | ORAL | 11 refills | Status: AC

## 2016-05-21 NOTE — Patient Instructions (Addendum)
Start taking xyzal tonight and I recommend you continue this throughout allergy season.     IF you received an x-ray today, you will receive an invoice from [facility]. Please contact [facility] at [phone] with questions or concerns regarding your invoice.   IF you received labwork today, you will receive an invoice from [facility]. Please contact [facility] at [phone] with questions or concerns regarding your invoice.   Our billing staff will not be able to assist you with questions regarding bills from these companies.  You will be contacted with the lab results as soon as they are available. The fastest way to get your results is to activate your My Chart account. Instructions are located on the last page of this paperwork. If you have not heard from [place] regarding the results in 2 weeks, please contact this office.

## 2016-05-21 NOTE — Progress Notes (Signed)
05/21/2016 10:24 AM   DOB: 1960-06-29 / MRN: [id number]  SUBJECTIVE:  Erik Grimes is a 56 y.o. male presenting for nasal congestion, non bloody cough productive of yellow sputum, wheezing that started 4 days ago.  Assoicates ear, nose, eye itching.  Denies history of asthma. He has tried Nyquil and Dayquil with mild relief.  He feels he is getting worse. Wife was sick last week with a fever and cough. He has a history of diabetes and takes metformin daily.   He has No Known Allergies.   He  has a past medical history of Allergy and Diabetes mellitus without complication (HCC).    He  reports that he has never smoked. He has never used smokeless tobacco. He reports that he does not drink alcohol or use drugs. He  has no sexual activity history on file. The patient  has no past surgical history on file.  His family history is not on file.  Review of Systems  Constitutional: Negative for chills, diaphoresis and fever.  Respiratory: Negative for shortness of breath.   Cardiovascular: Negative for chest pain, orthopnea and leg swelling.  Gastrointestinal: Negative for nausea.  Skin: Negative for rash.  Neurological: Negative for dizziness.    The problem list and medications were reviewed and updated by myself where necessary and exist elsewhere in the encounter.   OBJECTIVE:  BP (!) 148/78   Pulse 70   Temp 97.8 F (36.6 C) (Oral)   Resp 16   Ht  (1.6 m)   Wt 120 lb (54.4 kg)   SpO2 99%   BMI 21.26 kg/m   Physical Exam  Constitutional: He appears well-developed. He is active and cooperative.  Non-toxic appearance.  HENT:  Right Ear: Hearing, tympanic membrane, external ear and ear canal normal.  Left Ear: Hearing, tympanic membrane, external ear and ear canal normal.  Nose: Nose normal. Right sinus exhibits no maxillary sinus tenderness and no frontal sinus tenderness. Left sinus exhibits no maxillary sinus tenderness and no frontal sinus tenderness.  Mouth/Throat:  Uvula is midline, oropharynx is clear and moist and mucous membranes are normal. No oropharyngeal exudate, posterior oropharyngeal edema or tonsillar abscesses.  Eyes: Conjunctivae are normal. Pupils are equal, round, and reactive to light.  Cardiovascular: Normal rate, regular rhythm, S1 normal, S2 normal, normal heart sounds, intact distal pulses and normal pulses.  Exam reveals no gallop and no friction rub.   No murmur heard. Pulmonary/Chest: Effort normal. No stridor. No tachypnea. No respiratory distress. He has no wheezes. He has no rales.  Abdominal: He exhibits no distension.  Musculoskeletal: He exhibits no edema.  Lymphadenopathy:       Head (right side): No submandibular and no tonsillar adenopathy present.       Head (left side): No submandibular and no tonsillar adenopathy present.    He has no cervical adenopathy.  Neurological: He is alert.  Skin: Skin is warm and dry. He is not diaphoretic. No pallor.  Vitals reviewed.     Results for orders placed or performed in visit on 05/21/16 (from the past 72 hour(s))  POCT glycosylated hemoglobin (Hb A1C)     Status: None   Collection Time: 05/21/16 10:23 AM  Result Value Ref Range   Hemoglobin A1C 6.1     No results found.  ASSESSMENT AND PLAN:  Elba was seen today for cough and nasal congestion.  Diagnoses and all orders for this visit:  Seasonal allergic rhinitis due to pollen: No infection features on exam.  -  POCT glycosylated hemoglobin (Hb A1C) -     levocetirizine (XYZAL) 5 MG tablet; Take 1 tablet (5 mg total) by mouth every evening.       -     Depomedrol   The patient is advised to call or return to clinic if he does not see an improvement in symptoms, or to seek the care of the closest emergency department if he worsens with the above plan.   Deliah Boston, MHS, PA-C [facility] 05/21/2016 10:24 AM

## 2016-05-23 DIAGNOSIS — M722 Plantar fascial fibromatosis: Secondary | ICD-10-CM | POA: Diagnosis not present

## 2016-05-23 DIAGNOSIS — M79675 Pain in left toe(s): Secondary | ICD-10-CM | POA: Diagnosis not present

## 2016-05-23 DIAGNOSIS — M79674 Pain in right toe(s): Secondary | ICD-10-CM | POA: Diagnosis not present

## 2016-05-23 DIAGNOSIS — M2042 Other hammer toe(s) (acquired), left foot: Secondary | ICD-10-CM | POA: Diagnosis not present

## 2016-05-23 DIAGNOSIS — M71572 Other bursitis, not elsewhere classified, left ankle and foot: Secondary | ICD-10-CM | POA: Diagnosis not present

## 2016-06-13 DIAGNOSIS — M71572 Other bursitis, not elsewhere classified, left ankle and foot: Secondary | ICD-10-CM | POA: Diagnosis not present

## 2016-06-13 DIAGNOSIS — M722 Plantar fascial fibromatosis: Secondary | ICD-10-CM | POA: Diagnosis not present

## 2016-07-04 DIAGNOSIS — M722 Plantar fascial fibromatosis: Secondary | ICD-10-CM | POA: Diagnosis not present

## 2016-07-04 DIAGNOSIS — M71572 Other bursitis, not elsewhere classified, left ankle and foot: Secondary | ICD-10-CM | POA: Diagnosis not present

## 2016-07-18 ENCOUNTER — Ambulatory Visit (INDEPENDENT_AMBULATORY_CARE_PROVIDER_SITE_OTHER): Payer: BLUE CROSS/BLUE SHIELD | Admitting: Physician Assistant

## 2016-07-18 ENCOUNTER — Encounter: Payer: Self-pay | Admitting: Physician Assistant

## 2016-07-18 VITALS — BP 136/78 | HR 66 | Temp 98.2°F | Ht 63.0 in | Wt 116.2 lb

## 2016-07-18 DIAGNOSIS — H9202 Otalgia, left ear: Secondary | ICD-10-CM

## 2016-07-18 MED ORDER — PREDNISONE 20 MG PO TABS: 40.0000 mg | ORAL_TABLET | Freq: Every day | ORAL | 0 refills | Status: AC

## 2016-07-18 MED ORDER — IBUPROFEN 600 MG PO TABS: 600.0000 mg | ORAL_TABLET | Freq: Four times a day (QID) | ORAL | 0 refills | Status: AC | PRN

## 2016-07-18 NOTE — Patient Instructions (Addendum)
If your ear pain does not improve in 1 week, I need you to return.   If it worsens, I want you to return immediately.     IF you received an x-ray today, you will receive an invoice from [facility]. Please contact [facility] at [phone] with questions or concerns regarding your invoice.   IF you received labwork today, you will receive an invoice from [facility]. Please contact [facility] at [phone] with questions or concerns regarding your invoice.   Our billing staff will not be able to assist you with questions regarding bills from these companies.  You will be contacted with the lab results as soon as they are available. The fastest way to get your results is to activate your My Chart account. Instructions are located on the last page of this paperwork. If you have not heard from us regarding the results in 2 weeks, please contact this office.

## 2016-07-18 NOTE — Progress Notes (Signed)
[facility] [address], [place] [id number] [phone]  Date:  07/18/2016   Name:  Erik Grimes   DOB:  Jan 05, 1961   MRN:  [id number]  PCP:  Fleet ContrasAvbuere, Edwin, MD    History of Present Illness:  Erik Grimes is a 56 y.o. male patient who presents to PCP with  Chief Complaint  Patient presents with  . Ear Pain    left facial pain x1 week, tried tylenol for pain but no relief     1 week ago, left ear pain feels like it is pumping.  No change in hearing.  No fever.  No nasal congestion.  No sore throat.  He has no hx of recent teeth issues or swelling. No drainage from the ear.  He took tylenol which did not help much.  No tinnitus.  He is not clinching teeth.  No dizziness.   He is taking his allergy medicine compliantly.    Patient Active Problem List   Diagnosis Date Noted  . Diabetes type 2, controlled (HCC) 05/14/2014  . HTN (hypertension) 05/14/2014  . Hyperlipidemia 05/14/2014    Past Medical History:  Diagnosis Date  . Allergy   . Diabetes mellitus without complication (HCC)     History reviewed. No pertinent surgical history.  Social History  Substance Use Topics  . Smoking status: Never Smoker  . Smokeless tobacco: Never Used  . Alcohol use No    History reviewed. No pertinent family history.  No Known Allergies  Medication list has been reviewed and updated.  Current Outpatient Prescriptions on File Prior to Visit  Medication Sig Dispense Refill  . amLODipine (NORVASC) 5 MG tablet Take 5 mg by mouth daily.    Marland Kitchen. levocetirizine (XYZAL) 5 MG tablet Take 1 tablet (5 mg total) by mouth every evening. 30 tablet 11  . losartan (COZAAR) 100 MG tablet Take 100 mg by mouth daily.    Marland Kitchen. lovastatin (MEVACOR) 10 MG tablet Take 10 mg by mouth daily.    . metFORMIN (GLUCOPHAGE) 500 MG tablet Take by mouth 2 (two) times daily with a meal.    . chlorpheniramine-HYDROcodone (TUSSIONEX PENNKINETIC ER) 10-8 MG/5ML SUER Take 5 mLs by mouth at  bedtime as needed for cough. (Patient not taking: Reported on 09/24/2015) 140 mL 0  . montelukast (SINGULAIR) 10 MG tablet Take 1 tablet (10 mg total) by mouth at bedtime. (Patient not taking: Reported on 05/21/2016) 30 tablet 3   No current facility-administered medications on file prior to visit.     ROS ROS otherwise unremarkable unless listed above.  Physical Examination: BP 136/78   Pulse 66   Temp 98.2 F (36.8 C) (Oral)   Ht 5\' 3"  (1.6 m)   Wt 116 lb 3.2 oz (52.7 kg)   SpO2 100%   BMI 20.58 kg/m  Ideal Body Weight: Weight in (lb) to have BMI = 25: 140.8  Physical Exam  Constitutional: He is oriented to person, place, and time. He appears well-developed and well-nourished. No distress.  HENT:  Head: Normocephalic and atraumatic.  Right Ear: Tympanic membrane, external ear and ear canal normal. No decreased hearing is noted.  Left Ear: Tympanic membrane, external ear and ear canal normal. No decreased hearing is noted.  Nose: No mucosal edema or rhinorrhea. Right sinus exhibits no maxillary sinus tenderness and no frontal sinus tenderness. Left sinus exhibits no maxillary sinus tenderness and no frontal sinus tenderness.  Mouth/Throat: No oral lesions. No dental abscesses. No oropharyngeal exudate, posterior oropharyngeal  edema or posterior oropharyngeal erythema.  Eyes: Conjunctivae and EOM are normal. Pupils are equal, round, and reactive to light.  Cardiovascular: Normal rate, regular rhythm and normal heart sounds.   Pulses:      Carotid pulses are 2+ on the right side, and 2+ on the left side. Pulmonary/Chest: Effort normal. No respiratory distress. He has no decreased breath sounds. He has no wheezes. He has no rhonchi.  Neurological: He is alert and oriented to person, place, and time.  Skin: Skin is warm and dry. He is not diaphoretic.  Psychiatric: He has a normal mood and affect. His behavior is normal.     Assessment and Plan: Erik Grimes is a 56 y.o.  male who is here today for cc of left ear pain. Possibility of etd, jaw-clinching, etc.   Given ibuprofen and short steroid course at this time.  He will follow up with me if sxs fail to improve within 1 week, or sooner if worsening symptoms. Left ear pain - Plan: predniSONE (DELTASONE) 20 MG tablet, ibuprofen (ADVIL,MOTRIN) 600 MG tablet  Trena Platt, PA-C [facility] 6/18/20186:46 AM

## 2016-07-24 DIAGNOSIS — M722 Plantar fascial fibromatosis: Secondary | ICD-10-CM | POA: Diagnosis not present

## 2016-07-24 DIAGNOSIS — M71572 Other bursitis, not elsewhere classified, left ankle and foot: Secondary | ICD-10-CM | POA: Diagnosis not present

## 2016-08-21 DIAGNOSIS — M71572 Other bursitis, not elsewhere classified, left ankle and foot: Secondary | ICD-10-CM | POA: Diagnosis not present

## 2016-08-21 DIAGNOSIS — M722 Plantar fascial fibromatosis: Secondary | ICD-10-CM | POA: Diagnosis not present

## 2016-08-26 DIAGNOSIS — I10 Essential (primary) hypertension: Secondary | ICD-10-CM | POA: Diagnosis not present

## 2016-08-26 DIAGNOSIS — E119 Type 2 diabetes mellitus without complications: Secondary | ICD-10-CM | POA: Diagnosis not present

## 2016-08-26 DIAGNOSIS — J302 Other seasonal allergic rhinitis: Secondary | ICD-10-CM | POA: Diagnosis not present

## 2016-08-26 DIAGNOSIS — K219 Gastro-esophageal reflux disease without esophagitis: Secondary | ICD-10-CM | POA: Diagnosis not present

## 2016-11-04 DIAGNOSIS — K219 Gastro-esophageal reflux disease without esophagitis: Secondary | ICD-10-CM | POA: Diagnosis not present

## 2016-11-04 DIAGNOSIS — M542 Cervicalgia: Secondary | ICD-10-CM | POA: Diagnosis not present

## 2016-11-04 DIAGNOSIS — E119 Type 2 diabetes mellitus without complications: Secondary | ICD-10-CM | POA: Diagnosis not present

## 2016-11-04 DIAGNOSIS — I10 Essential (primary) hypertension: Secondary | ICD-10-CM | POA: Diagnosis not present

## 2016-12-16 DIAGNOSIS — I10 Essential (primary) hypertension: Secondary | ICD-10-CM | POA: Diagnosis not present

## 2016-12-16 DIAGNOSIS — Z23 Encounter for immunization: Secondary | ICD-10-CM | POA: Diagnosis not present

## 2016-12-16 DIAGNOSIS — K219 Gastro-esophageal reflux disease without esophagitis: Secondary | ICD-10-CM | POA: Diagnosis not present

## 2016-12-16 DIAGNOSIS — E7849 Other hyperlipidemia: Secondary | ICD-10-CM | POA: Diagnosis not present

## 2016-12-16 DIAGNOSIS — E119 Type 2 diabetes mellitus without complications: Secondary | ICD-10-CM | POA: Diagnosis not present

## 2016-12-16 DIAGNOSIS — Z1211 Encounter for screening for malignant neoplasm of colon: Secondary | ICD-10-CM | POA: Diagnosis not present

## 2017-01-20 DIAGNOSIS — I10 Essential (primary) hypertension: Secondary | ICD-10-CM | POA: Diagnosis not present

## 2017-01-20 DIAGNOSIS — M6580 Other synovitis and tenosynovitis, unspecified site: Secondary | ICD-10-CM | POA: Diagnosis not present

## 2017-01-20 DIAGNOSIS — E119 Type 2 diabetes mellitus without complications: Secondary | ICD-10-CM | POA: Diagnosis not present

## 2017-02-17 DIAGNOSIS — L209 Atopic dermatitis, unspecified: Secondary | ICD-10-CM | POA: Diagnosis not present

## 2017-02-17 DIAGNOSIS — E7849 Other hyperlipidemia: Secondary | ICD-10-CM | POA: Diagnosis not present

## 2017-02-17 DIAGNOSIS — I10 Essential (primary) hypertension: Secondary | ICD-10-CM | POA: Diagnosis not present

## 2017-02-17 DIAGNOSIS — E119 Type 2 diabetes mellitus without complications: Secondary | ICD-10-CM | POA: Diagnosis not present

## 2017-02-17 DIAGNOSIS — Z125 Encounter for screening for malignant neoplasm of prostate: Secondary | ICD-10-CM | POA: Diagnosis not present

## 2017-03-31 DIAGNOSIS — K219 Gastro-esophageal reflux disease without esophagitis: Secondary | ICD-10-CM | POA: Diagnosis not present

## 2017-03-31 DIAGNOSIS — E119 Type 2 diabetes mellitus without complications: Secondary | ICD-10-CM | POA: Diagnosis not present

## 2017-03-31 DIAGNOSIS — E7849 Other hyperlipidemia: Secondary | ICD-10-CM | POA: Diagnosis not present

## 2017-03-31 DIAGNOSIS — I10 Essential (primary) hypertension: Secondary | ICD-10-CM | POA: Diagnosis not present

## 2017-05-13 ENCOUNTER — Encounter: Payer: Self-pay | Admitting: Physician Assistant

## 2017-08-24 DIAGNOSIS — E119 Type 2 diabetes mellitus without complications: Secondary | ICD-10-CM | POA: Diagnosis not present

## 2017-08-24 DIAGNOSIS — R502 Drug induced fever: Secondary | ICD-10-CM | POA: Diagnosis not present

## 2017-09-08 DIAGNOSIS — I10 Essential (primary) hypertension: Secondary | ICD-10-CM | POA: Diagnosis not present

## 2017-09-08 DIAGNOSIS — E119 Type 2 diabetes mellitus without complications: Secondary | ICD-10-CM | POA: Diagnosis not present

## 2017-12-08 DIAGNOSIS — I10 Essential (primary) hypertension: Secondary | ICD-10-CM | POA: Diagnosis not present

## 2017-12-08 DIAGNOSIS — K219 Gastro-esophageal reflux disease without esophagitis: Secondary | ICD-10-CM | POA: Diagnosis not present

## 2017-12-08 DIAGNOSIS — Z23 Encounter for immunization: Secondary | ICD-10-CM | POA: Diagnosis not present

## 2017-12-08 DIAGNOSIS — E119 Type 2 diabetes mellitus without complications: Secondary | ICD-10-CM | POA: Diagnosis not present

## 2018-01-29 DIAGNOSIS — I10 Essential (primary) hypertension: Secondary | ICD-10-CM | POA: Diagnosis not present

## 2018-01-29 DIAGNOSIS — L03114 Cellulitis of left upper limb: Secondary | ICD-10-CM | POA: Diagnosis not present

## 2018-03-09 DIAGNOSIS — E119 Type 2 diabetes mellitus without complications: Secondary | ICD-10-CM | POA: Diagnosis not present

## 2018-03-09 DIAGNOSIS — I10 Essential (primary) hypertension: Secondary | ICD-10-CM | POA: Diagnosis not present

## 2018-03-09 DIAGNOSIS — E7849 Other hyperlipidemia: Secondary | ICD-10-CM | POA: Diagnosis not present

## 2018-03-09 DIAGNOSIS — Z125 Encounter for screening for malignant neoplasm of prostate: Secondary | ICD-10-CM | POA: Diagnosis not present

## 2018-03-09 DIAGNOSIS — M6580 Other synovitis and tenosynovitis, unspecified site: Secondary | ICD-10-CM | POA: Diagnosis not present

## 2018-05-04 DIAGNOSIS — J302 Other seasonal allergic rhinitis: Secondary | ICD-10-CM | POA: Diagnosis not present

## 2018-05-04 DIAGNOSIS — E119 Type 2 diabetes mellitus without complications: Secondary | ICD-10-CM | POA: Diagnosis not present

## 2018-05-04 DIAGNOSIS — I10 Essential (primary) hypertension: Secondary | ICD-10-CM | POA: Diagnosis not present

## 2018-05-12 DIAGNOSIS — J209 Acute bronchitis, unspecified: Secondary | ICD-10-CM | POA: Diagnosis not present

## 2018-05-12 DIAGNOSIS — I10 Essential (primary) hypertension: Secondary | ICD-10-CM | POA: Diagnosis not present

## 2018-05-12 DIAGNOSIS — E119 Type 2 diabetes mellitus without complications: Secondary | ICD-10-CM | POA: Diagnosis not present

## 2018-05-24 ENCOUNTER — Emergency Department (HOSPITAL_COMMUNITY)
Admission: EM | Admit: 2018-05-24 | Discharge: 2018-05-24 | Disposition: A | Discharge status: Home / Self Care | Payer: BLUE CROSS/BLUE SHIELD | Attending: Emergency Medicine | Admitting: Emergency Medicine

## 2018-05-24 ENCOUNTER — Other Ambulatory Visit: Payer: Self-pay

## 2018-05-24 ENCOUNTER — Encounter (HOSPITAL_COMMUNITY): Payer: Self-pay | Admitting: Emergency Medicine

## 2018-05-24 DIAGNOSIS — J9801 Acute bronchospasm: Secondary | ICD-10-CM | POA: Diagnosis not present

## 2018-05-24 DIAGNOSIS — I10 Essential (primary) hypertension: Secondary | ICD-10-CM | POA: Insufficient documentation

## 2018-05-24 DIAGNOSIS — Z7984 Long term (current) use of oral hypoglycemic drugs: Secondary | ICD-10-CM | POA: Insufficient documentation

## 2018-05-24 DIAGNOSIS — R0602 Shortness of breath: Secondary | ICD-10-CM | POA: Diagnosis not present

## 2018-05-24 DIAGNOSIS — E119 Type 2 diabetes mellitus without complications: Secondary | ICD-10-CM | POA: Diagnosis not present

## 2018-05-24 DIAGNOSIS — Z79899 Other long term (current) drug therapy: Secondary | ICD-10-CM | POA: Diagnosis not present

## 2018-05-24 MED ORDER — ALBUTEROL SULFATE HFA 108 (90 BASE) MCG/ACT IN AERS
2.0000 | INHALATION_SPRAY | Freq: Once | RESPIRATORY_TRACT | Status: AC
Start: 2018-05-24 — End: 2018-05-24
  Administered 2018-05-24: 19:00:00 2 via RESPIRATORY_TRACT
  Filled 2018-05-24: qty 6.7

## 2018-05-24 MED ORDER — ALBUTEROL SULFATE HFA 108 (90 BASE) MCG/ACT IN AERS: 2.0000 | INHALATION_SPRAY | RESPIRATORY_TRACT | Status: DC

## 2018-05-24 NOTE — Discharge Instructions (Addendum)
Use 1 to 2 puffs of your inhaler every 4-6 hours

## 2018-05-24 NOTE — ED Provider Notes (Signed)
[facility]-EMERGENCY DEPT Provider Note   CSN: [id number] Arrival date & time: 05/24/18  1811    History   Chief Complaint Chief Complaint  Patient presents with  . Nasal Congestion  . Shortness of Breath  . Back Pain    HPI Erik Grimes is a 58 y.o. male.     58 year old male presents after becoming short of breath when he inhaled dust particles at work.  Has a history of seasonal allergies and feels that this is slightly similar.  Has been prescribed an albuterol inhaler which he did not use.  Denies any fever or chills.  No chest pain or chest discomfort.  Has had no recent illnesses.  Felt better after he went outside from his job     Past Medical History:  Diagnosis Date  . Allergy   . Diabetes mellitus without complication [facility])     Patient Active Problem List   Diagnosis Date Noted  . Diabetes type 2, controlled (HCC) 05/14/2014  . HTN (hypertension) 05/14/2014  . Hyperlipidemia 05/14/2014    History reviewed. No pertinent surgical history.      Home Medications    Prior to Admission medications   Medication Sig Start Date End Date Taking? Authorizing Provider  amLODipine (NORVASC) 5 MG tablet Take 5 mg by mouth daily.    [provider]  chlorpheniramine-HYDROcodone (TUSSIONEX PENNKINETIC ER) 10-8 MG/5ML SUER Take 5 mLs by mouth at bedtime as needed for cough. Patient not taking: Reported on 09/24/2015 05/17/15   Wallis Bamberg, PA-C  ibuprofen (ADVIL,MOTRIN) 600 MG tablet Take 1 tablet (600 mg total) by mouth every 6 (six) hours as needed (ear pain). 07/18/16   Trena Platt D, PA  levocetirizine (XYZAL) 5 MG tablet Take 1 tablet (5 mg total) by mouth every evening. 05/21/16   Ofilia Neas, PA-C  losartan (COZAAR) 100 MG tablet Take 100 mg by mouth daily.    [provider]  lovastatin (MEVACOR) 10 MG tablet Take 10 mg by mouth daily.    [provider]  metFORMIN (GLUCOPHAGE) 500 MG tablet Take  by mouth 2 (two) times daily with a meal.    [provider]  montelukast (SINGULAIR) 10 MG tablet Take 1 tablet (10 mg total) by mouth at bedtime. Patient not taking: Reported on 05/21/2016 05/17/15   Wallis Bamberg, PA-C  predniSONE (DELTASONE) 20 MG tablet Take 2 tablets (40 mg total) by mouth daily with breakfast. 07/18/16   Garnetta Buddy, PA    Family History No family history on file.  Social History Social History   Tobacco Use  . Smoking status: Never Smoker  . Smokeless tobacco: Never Used  Substance Use Topics  . Alcohol use: No    Alcohol/week: 0.0 standard drinks  . Drug use: No     Allergies   Patient has no known allergies.   Review of Systems Review of Systems  All other systems reviewed and are negative.    Physical Exam Updated Vital Signs BP 132/74 (BP Location: Right Arm)   Pulse 94   Temp 98.1 F (36.7 C) (Oral)   Resp 16   Ht 1.6 m (5\' 3" )   Wt 54.4 kg   SpO2 100%   BMI 21.26 kg/m   Physical Exam Vitals signs and nursing note reviewed.  Constitutional:      General: He is not in acute distress.    Appearance: Normal appearance. He is well-developed. He is not toxic-appearing.  HENT:  Head: Normocephalic and atraumatic.  Eyes:     General: Lids are normal.     Conjunctiva/sclera: Conjunctivae normal.     Pupils: Pupils are equal, round, and reactive to light.  Neck:     Musculoskeletal: Normal range of motion and neck supple.     Thyroid: No thyroid mass.     Trachea: No tracheal deviation.  Cardiovascular:     Rate and Rhythm: Normal rate and regular rhythm.     Heart sounds: Normal heart sounds. No murmur. No gallop.   Pulmonary:     Effort: Pulmonary effort is normal. No respiratory distress.     Breath sounds: Normal breath sounds. No stridor. No decreased breath sounds, wheezing, rhonchi or rales.  Abdominal:     General: Bowel sounds are normal. There is no distension.     Palpations: Abdomen is soft.      Tenderness: There is no abdominal tenderness. There is no rebound.  Musculoskeletal: Normal range of motion.        General: No tenderness.  Skin:    General: Skin is warm and dry.     Findings: No abrasion or rash.  Neurological:     Mental Status: He is alert and oriented to person, place, and time.     GCS: GCS eye subscore is 4. GCS verbal subscore is 5. GCS motor subscore is 6.     Cranial Nerves: No cranial nerve deficit.     Sensory: No sensory deficit.  Psychiatric:        Speech: Speech normal.        Behavior: Behavior normal.      ED Treatments / Results  Labs (all labs ordered are listed, but only abnormal results are displayed) Labs Reviewed - No data to display  EKG None  Radiology No results found.  Procedures Procedures (including critical care time)  Medications Ordered in ED Medications  albuterol (VENTOLIN HFA) 108 (90 Base) MCG/ACT inhaler 2 puff (has no administration in time range)     Initial Impression / Assessment and Plan / ED Course  I have reviewed the triage vital signs and the nursing notes.  Pertinent labs & imaging results that were available during my care of the patient were reviewed by me and considered in my medical decision making (see chart for details).        Patient given 2 puffs of his inhaler and feels better.  Suspect bronchospasm.  Stable for discharge  Erik Grimes was evaluated in Emergency Department on 05/24/2018 for the symptoms described in the history of present illness. He was evaluated in the context of the global COVID-19 pandemic, which necessitated consideration that the patient might be at risk for infection with the SARS-CoV-2 virus that causes COVID-19. Institutional protocols and algorithms that pertain to the evaluation of patients at risk for COVID-19 are in a state of rapid change based on information released by regulatory bodies including the CDC and federal and state organizations. These policies  and algorithms were followed during the patient's care in the ED.   Final Clinical Impressions(s) / ED Diagnoses   Final diagnoses:  None    ED Discharge Orders    None       Lorre NickAllen, Eliyah Bazzi, MD 05/24/18 Serena [id number]

## 2018-05-24 NOTE — ED Triage Notes (Signed)
Pt reports for over 2 weeks had chest congestion, SOB. Was seen on April 8 and started on medications. Denies travel, unsure if others in house have been sick. Denies cough.

## 2018-06-08 DIAGNOSIS — J302 Other seasonal allergic rhinitis: Secondary | ICD-10-CM | POA: Diagnosis not present

## 2018-06-08 DIAGNOSIS — I10 Essential (primary) hypertension: Secondary | ICD-10-CM | POA: Diagnosis not present

## 2018-06-08 DIAGNOSIS — E119 Type 2 diabetes mellitus without complications: Secondary | ICD-10-CM | POA: Diagnosis not present

## 2018-07-20 DIAGNOSIS — I10 Essential (primary) hypertension: Secondary | ICD-10-CM | POA: Diagnosis not present

## 2018-07-20 DIAGNOSIS — E119 Type 2 diabetes mellitus without complications: Secondary | ICD-10-CM | POA: Diagnosis not present

## 2018-07-20 DIAGNOSIS — E7849 Other hyperlipidemia: Secondary | ICD-10-CM | POA: Diagnosis not present

## 2018-07-20 DIAGNOSIS — J302 Other seasonal allergic rhinitis: Secondary | ICD-10-CM | POA: Diagnosis not present

## 2018-10-19 DIAGNOSIS — Z23 Encounter for immunization: Secondary | ICD-10-CM | POA: Diagnosis not present

## 2018-10-19 DIAGNOSIS — M6580 Other synovitis and tenosynovitis, unspecified site: Secondary | ICD-10-CM | POA: Diagnosis not present

## 2018-10-19 DIAGNOSIS — E7849 Other hyperlipidemia: Secondary | ICD-10-CM | POA: Diagnosis not present

## 2018-10-19 DIAGNOSIS — E119 Type 2 diabetes mellitus without complications: Secondary | ICD-10-CM | POA: Diagnosis not present

## 2018-10-19 DIAGNOSIS — I10 Essential (primary) hypertension: Secondary | ICD-10-CM | POA: Diagnosis not present

## 2019-01-18 DIAGNOSIS — M545 Low back pain: Secondary | ICD-10-CM | POA: Diagnosis not present

## 2019-01-18 DIAGNOSIS — E7849 Other hyperlipidemia: Secondary | ICD-10-CM | POA: Diagnosis not present

## 2019-01-18 DIAGNOSIS — E119 Type 2 diabetes mellitus without complications: Secondary | ICD-10-CM | POA: Diagnosis not present

## 2019-01-18 DIAGNOSIS — I10 Essential (primary) hypertension: Secondary | ICD-10-CM | POA: Diagnosis not present

## 2019-04-19 DIAGNOSIS — I10 Essential (primary) hypertension: Secondary | ICD-10-CM | POA: Diagnosis not present

## 2019-04-19 DIAGNOSIS — E119 Type 2 diabetes mellitus without complications: Secondary | ICD-10-CM | POA: Diagnosis not present

## 2019-04-19 DIAGNOSIS — Z0000 Encounter for general adult medical examination without abnormal findings: Secondary | ICD-10-CM | POA: Diagnosis not present

## 2019-04-19 DIAGNOSIS — Z79899 Other long term (current) drug therapy: Secondary | ICD-10-CM | POA: Diagnosis not present

## 2019-04-19 DIAGNOSIS — E559 Vitamin D deficiency, unspecified: Secondary | ICD-10-CM | POA: Diagnosis not present

## 2019-04-19 DIAGNOSIS — E7849 Other hyperlipidemia: Secondary | ICD-10-CM | POA: Diagnosis not present

## 2019-04-19 DIAGNOSIS — M6580 Other synovitis and tenosynovitis, unspecified site: Secondary | ICD-10-CM | POA: Diagnosis not present

## 2019-04-19 DIAGNOSIS — Z125 Encounter for screening for malignant neoplasm of prostate: Secondary | ICD-10-CM | POA: Diagnosis not present

## 2019-05-04 DIAGNOSIS — M2042 Other hammer toe(s) (acquired), left foot: Secondary | ICD-10-CM | POA: Diagnosis not present

## 2019-05-04 DIAGNOSIS — B353 Tinea pedis: Secondary | ICD-10-CM | POA: Diagnosis not present

## 2019-05-04 DIAGNOSIS — M71572 Other bursitis, not elsewhere classified, left ankle and foot: Secondary | ICD-10-CM | POA: Diagnosis not present

## 2019-05-04 DIAGNOSIS — M2041 Other hammer toe(s) (acquired), right foot: Secondary | ICD-10-CM | POA: Diagnosis not present

## 2019-05-04 DIAGNOSIS — M71571 Other bursitis, not elsewhere classified, right ankle and foot: Secondary | ICD-10-CM | POA: Diagnosis not present

## 2019-05-13 DIAGNOSIS — Z23 Encounter for immunization: Secondary | ICD-10-CM | POA: Diagnosis not present

## 2019-05-17 DIAGNOSIS — E7849 Other hyperlipidemia: Secondary | ICD-10-CM | POA: Diagnosis not present

## 2019-05-17 DIAGNOSIS — E119 Type 2 diabetes mellitus without complications: Secondary | ICD-10-CM | POA: Diagnosis not present

## 2019-05-17 DIAGNOSIS — I10 Essential (primary) hypertension: Secondary | ICD-10-CM | POA: Diagnosis not present

## 2019-05-17 DIAGNOSIS — J302 Other seasonal allergic rhinitis: Secondary | ICD-10-CM | POA: Diagnosis not present

## 2019-05-25 DIAGNOSIS — B351 Tinea unguium: Secondary | ICD-10-CM | POA: Diagnosis not present

## 2019-05-25 DIAGNOSIS — B353 Tinea pedis: Secondary | ICD-10-CM | POA: Diagnosis not present

## 2019-05-25 DIAGNOSIS — L600 Ingrowing nail: Secondary | ICD-10-CM | POA: Diagnosis not present

## 2019-05-25 DIAGNOSIS — M79675 Pain in left toe(s): Secondary | ICD-10-CM | POA: Diagnosis not present

## 2019-05-25 DIAGNOSIS — M79674 Pain in right toe(s): Secondary | ICD-10-CM | POA: Diagnosis not present

## 2019-06-10 DIAGNOSIS — Z23 Encounter for immunization: Secondary | ICD-10-CM | POA: Diagnosis not present

## 2019-06-22 DIAGNOSIS — M79674 Pain in right toe(s): Secondary | ICD-10-CM | POA: Diagnosis not present

## 2019-06-22 DIAGNOSIS — L600 Ingrowing nail: Secondary | ICD-10-CM | POA: Diagnosis not present

## 2019-06-22 DIAGNOSIS — B351 Tinea unguium: Secondary | ICD-10-CM | POA: Diagnosis not present

## 2019-06-22 DIAGNOSIS — M79675 Pain in left toe(s): Secondary | ICD-10-CM | POA: Diagnosis not present

## 2019-08-16 DIAGNOSIS — E7849 Other hyperlipidemia: Secondary | ICD-10-CM | POA: Diagnosis not present

## 2019-08-16 DIAGNOSIS — J302 Other seasonal allergic rhinitis: Secondary | ICD-10-CM | POA: Diagnosis not present

## 2019-08-16 DIAGNOSIS — I10 Essential (primary) hypertension: Secondary | ICD-10-CM | POA: Diagnosis not present

## 2019-08-16 DIAGNOSIS — E119 Type 2 diabetes mellitus without complications: Secondary | ICD-10-CM | POA: Diagnosis not present

## 2019-12-20 DIAGNOSIS — E119 Type 2 diabetes mellitus without complications: Secondary | ICD-10-CM | POA: Diagnosis not present

## 2019-12-20 DIAGNOSIS — I10 Essential (primary) hypertension: Secondary | ICD-10-CM | POA: Diagnosis not present

## 2019-12-20 DIAGNOSIS — M6580 Other synovitis and tenosynovitis, unspecified site: Secondary | ICD-10-CM | POA: Diagnosis not present

## 2019-12-20 DIAGNOSIS — M5459 Other low back pain: Secondary | ICD-10-CM | POA: Diagnosis not present

## 2020-01-03 DIAGNOSIS — M7712 Lateral epicondylitis, left elbow: Secondary | ICD-10-CM | POA: Diagnosis not present

## 2020-02-29 DIAGNOSIS — M79672 Pain in left foot: Secondary | ICD-10-CM | POA: Diagnosis not present

## 2020-02-29 DIAGNOSIS — B351 Tinea unguium: Secondary | ICD-10-CM | POA: Diagnosis not present

## 2020-02-29 DIAGNOSIS — M79671 Pain in right foot: Secondary | ICD-10-CM | POA: Diagnosis not present

## 2020-02-29 DIAGNOSIS — L600 Ingrowing nail: Secondary | ICD-10-CM | POA: Diagnosis not present

## 2020-03-20 ENCOUNTER — Other Ambulatory Visit: Payer: Self-pay | Admitting: Internal Medicine

## 2020-03-20 DIAGNOSIS — E119 Type 2 diabetes mellitus without complications: Secondary | ICD-10-CM | POA: Diagnosis not present

## 2020-03-20 DIAGNOSIS — Z125 Encounter for screening for malignant neoplasm of prostate: Secondary | ICD-10-CM | POA: Diagnosis not present

## 2020-03-20 DIAGNOSIS — I10 Essential (primary) hypertension: Secondary | ICD-10-CM | POA: Diagnosis not present

## 2020-03-20 DIAGNOSIS — E7849 Other hyperlipidemia: Secondary | ICD-10-CM | POA: Diagnosis not present

## 2020-03-20 DIAGNOSIS — Z0000 Encounter for general adult medical examination without abnormal findings: Secondary | ICD-10-CM | POA: Diagnosis not present

## 2020-03-20 DIAGNOSIS — E559 Vitamin D deficiency, unspecified: Secondary | ICD-10-CM | POA: Diagnosis not present

## 2020-03-21 LAB — COMPLETE METABOLIC PANEL WITH GFR
AG Ratio: 1.5 (calc) (ref 1.0–2.5)
ALT: 30 U/L (ref 9–46)
AST: 26 U/L (ref 10–35)
Albumin: 4.1 g/dL (ref 3.6–5.1)
Alkaline phosphatase (APISO): 55 U/L (ref 35–144)
BUN: 17 mg/dL (ref 7–25)
CO2: 28 mmol/L (ref 20–32)
Calcium: 9.4 mg/dL (ref 8.6–10.3)
Chloride: 102 mmol/L (ref 98–110)
Creat: 1.09 mg/dL (ref 0.70–1.33)
GFR, Est African American: 86 mL/min/{1.73_m2} (ref >=60)
GFR, Est Non African American: 74 mL/min/{1.73_m2} (ref >=60)
Globulin: 2.8 g/dL (calc) (ref 1.9–3.7)
Glucose, Bld: 113 mg/dL — ABNORMAL HIGH (ref 65–99)
Potassium: 4 mmol/L (ref 3.5–5.3)
Sodium: 139 mmol/L (ref 135–146)
Total Bilirubin: 0.8 mg/dL (ref 0.2–1.2)
Total Protein: 6.9 g/dL (ref 6.1–8.1)

## 2020-03-21 LAB — CBC
HCT: 41.6 % (ref 38.5–50.0)
Hemoglobin: 14 g/dL (ref 13.2–17.1)
MCH: 31.4 pg (ref 27.0–33.0)
MCHC: 33.7 g/dL (ref 32.0–36.0)
MCV: 93.3 fL (ref 80.0–100.0)
MPV: 9.5 fL (ref 7.5–12.5)
Platelets: 180 10*3/uL (ref 140–400)
RBC: 4.46 10*6/uL (ref 4.20–5.80)
RDW: 12.2 % (ref 11.0–15.0)
WBC: 5.7 10*3/uL (ref 3.8–10.8)

## 2020-03-21 LAB — VITAMIN D 25 HYDROXY (VIT D DEFICIENCY, FRACTURES): Vit D, 25-Hydroxy: 22 ng/mL — ABNORMAL LOW (ref 30–100)

## 2020-03-21 LAB — LIPID PANEL
Cholesterol: 177 mg/dL (ref <200)
HDL: 74 mg/dL (ref >=40)
LDL Cholesterol (Calc): 86 mg/dL (calc)
Non-HDL Cholesterol (Calc): 103 mg/dL (calc) (ref <130)
Total CHOL/HDL Ratio: 2.4 (calc) (ref <5.0)
Triglycerides: 76 mg/dL (ref <150)

## 2020-03-21 LAB — PSA: PSA: 1.56 ng/mL (ref <=4.0)

## 2020-03-21 LAB — TSH: TSH: 0.86 mIU/L (ref 0.40–4.50)

## 2020-03-28 DIAGNOSIS — M79675 Pain in left toe(s): Secondary | ICD-10-CM | POA: Diagnosis not present

## 2020-03-28 DIAGNOSIS — B351 Tinea unguium: Secondary | ICD-10-CM | POA: Diagnosis not present

## 2020-03-28 DIAGNOSIS — M79674 Pain in right toe(s): Secondary | ICD-10-CM | POA: Diagnosis not present

## 2020-03-28 DIAGNOSIS — L600 Ingrowing nail: Secondary | ICD-10-CM | POA: Diagnosis not present

## 2020-03-31 DIAGNOSIS — Z20822 Contact with and (suspected) exposure to covid-19: Secondary | ICD-10-CM | POA: Diagnosis not present

## 2020-04-01 DIAGNOSIS — Z20822 Contact with and (suspected) exposure to covid-19: Secondary | ICD-10-CM | POA: Diagnosis not present

## 2020-05-16 DIAGNOSIS — M79674 Pain in right toe(s): Secondary | ICD-10-CM | POA: Diagnosis not present

## 2020-05-16 DIAGNOSIS — B351 Tinea unguium: Secondary | ICD-10-CM | POA: Diagnosis not present

## 2020-05-16 DIAGNOSIS — M79675 Pain in left toe(s): Secondary | ICD-10-CM | POA: Diagnosis not present

## 2020-05-16 DIAGNOSIS — L600 Ingrowing nail: Secondary | ICD-10-CM | POA: Diagnosis not present

## 2020-05-22 DIAGNOSIS — R059 Cough, unspecified: Secondary | ICD-10-CM | POA: Diagnosis not present

## 2020-05-22 DIAGNOSIS — J302 Other seasonal allergic rhinitis: Secondary | ICD-10-CM | POA: Diagnosis not present

## 2020-05-22 DIAGNOSIS — J22 Unspecified acute lower respiratory infection: Secondary | ICD-10-CM | POA: Diagnosis not present

## 2020-05-23 DIAGNOSIS — E119 Type 2 diabetes mellitus without complications: Secondary | ICD-10-CM | POA: Diagnosis not present

## 2020-05-23 DIAGNOSIS — I10 Essential (primary) hypertension: Secondary | ICD-10-CM | POA: Diagnosis not present

## 2020-05-23 DIAGNOSIS — E7849 Other hyperlipidemia: Secondary | ICD-10-CM | POA: Diagnosis not present

## 2020-05-23 DIAGNOSIS — J302 Other seasonal allergic rhinitis: Secondary | ICD-10-CM | POA: Diagnosis not present

## 2020-06-19 DIAGNOSIS — E7849 Other hyperlipidemia: Secondary | ICD-10-CM | POA: Diagnosis not present

## 2020-06-19 DIAGNOSIS — I10 Essential (primary) hypertension: Secondary | ICD-10-CM | POA: Diagnosis not present

## 2020-06-19 DIAGNOSIS — J302 Other seasonal allergic rhinitis: Secondary | ICD-10-CM | POA: Diagnosis not present

## 2020-06-19 DIAGNOSIS — E119 Type 2 diabetes mellitus without complications: Secondary | ICD-10-CM | POA: Diagnosis not present

## 2020-09-28 DIAGNOSIS — J302 Other seasonal allergic rhinitis: Secondary | ICD-10-CM | POA: Diagnosis not present

## 2020-09-28 DIAGNOSIS — E119 Type 2 diabetes mellitus without complications: Secondary | ICD-10-CM | POA: Diagnosis not present

## 2020-09-28 DIAGNOSIS — I10 Essential (primary) hypertension: Secondary | ICD-10-CM | POA: Diagnosis not present

## 2021-03-28 DIAGNOSIS — I10 Essential (primary) hypertension: Secondary | ICD-10-CM | POA: Diagnosis not present

## 2021-03-28 DIAGNOSIS — E7849 Other hyperlipidemia: Secondary | ICD-10-CM | POA: Diagnosis not present

## 2021-03-28 DIAGNOSIS — E119 Type 2 diabetes mellitus without complications: Secondary | ICD-10-CM | POA: Diagnosis not present

## 2021-03-28 DIAGNOSIS — J302 Other seasonal allergic rhinitis: Secondary | ICD-10-CM | POA: Diagnosis not present

## 2021-03-28 DIAGNOSIS — R590 Localized enlarged lymph nodes: Secondary | ICD-10-CM | POA: Diagnosis not present

## 2021-03-28 DIAGNOSIS — E559 Vitamin D deficiency, unspecified: Secondary | ICD-10-CM | POA: Diagnosis not present

## 2021-03-28 DIAGNOSIS — Z0000 Encounter for general adult medical examination without abnormal findings: Secondary | ICD-10-CM | POA: Diagnosis not present

## 2021-03-28 DIAGNOSIS — Z125 Encounter for screening for malignant neoplasm of prostate: Secondary | ICD-10-CM | POA: Diagnosis not present

## 2021-06-27 DIAGNOSIS — E7849 Other hyperlipidemia: Secondary | ICD-10-CM | POA: Diagnosis not present

## 2021-06-27 DIAGNOSIS — E119 Type 2 diabetes mellitus without complications: Secondary | ICD-10-CM | POA: Diagnosis not present

## 2021-06-27 DIAGNOSIS — I10 Essential (primary) hypertension: Secondary | ICD-10-CM | POA: Diagnosis not present

## 2021-06-27 DIAGNOSIS — M6580 Other synovitis and tenosynovitis, unspecified site: Secondary | ICD-10-CM | POA: Diagnosis not present

## 2021-09-05 DIAGNOSIS — Z1211 Encounter for screening for malignant neoplasm of colon: Secondary | ICD-10-CM | POA: Diagnosis not present

## 2021-09-05 DIAGNOSIS — E782 Mixed hyperlipidemia: Secondary | ICD-10-CM | POA: Diagnosis not present

## 2021-09-05 DIAGNOSIS — K625 Hemorrhage of anus and rectum: Secondary | ICD-10-CM | POA: Diagnosis not present

## 2021-09-05 DIAGNOSIS — I10 Essential (primary) hypertension: Secondary | ICD-10-CM | POA: Diagnosis not present

## 2021-10-11 DIAGNOSIS — Z1211 Encounter for screening for malignant neoplasm of colon: Secondary | ICD-10-CM | POA: Diagnosis not present

## 2021-10-11 DIAGNOSIS — K625 Hemorrhage of anus and rectum: Secondary | ICD-10-CM | POA: Diagnosis not present

## 2021-10-31 DIAGNOSIS — E119 Type 2 diabetes mellitus without complications: Secondary | ICD-10-CM | POA: Diagnosis not present

## 2021-10-31 DIAGNOSIS — J302 Other seasonal allergic rhinitis: Secondary | ICD-10-CM | POA: Diagnosis not present

## 2021-10-31 DIAGNOSIS — I10 Essential (primary) hypertension: Secondary | ICD-10-CM | POA: Diagnosis not present

## 2021-10-31 DIAGNOSIS — M6580 Other synovitis and tenosynovitis, unspecified site: Secondary | ICD-10-CM | POA: Diagnosis not present

## 2022-01-14 DIAGNOSIS — E119 Type 2 diabetes mellitus without complications: Secondary | ICD-10-CM | POA: Diagnosis not present

## 2022-01-14 DIAGNOSIS — I10 Essential (primary) hypertension: Secondary | ICD-10-CM | POA: Diagnosis not present

## 2022-01-14 DIAGNOSIS — M6580 Other synovitis and tenosynovitis, unspecified site: Secondary | ICD-10-CM | POA: Diagnosis not present

## 2022-02-25 DIAGNOSIS — I10 Essential (primary) hypertension: Secondary | ICD-10-CM | POA: Diagnosis not present

## 2022-02-25 DIAGNOSIS — E7849 Other hyperlipidemia: Secondary | ICD-10-CM | POA: Diagnosis not present

## 2022-02-25 DIAGNOSIS — Z0000 Encounter for general adult medical examination without abnormal findings: Secondary | ICD-10-CM | POA: Diagnosis not present

## 2022-02-25 DIAGNOSIS — Z125 Encounter for screening for malignant neoplasm of prostate: Secondary | ICD-10-CM | POA: Diagnosis not present

## 2022-02-25 DIAGNOSIS — E559 Vitamin D deficiency, unspecified: Secondary | ICD-10-CM | POA: Diagnosis not present

## 2022-02-25 DIAGNOSIS — E1165 Type 2 diabetes mellitus with hyperglycemia: Secondary | ICD-10-CM | POA: Diagnosis not present

## 2022-03-06 DIAGNOSIS — E1165 Type 2 diabetes mellitus with hyperglycemia: Secondary | ICD-10-CM | POA: Diagnosis not present

## 2022-04-10 DIAGNOSIS — J302 Other seasonal allergic rhinitis: Secondary | ICD-10-CM | POA: Diagnosis not present

## 2022-04-10 DIAGNOSIS — E119 Type 2 diabetes mellitus without complications: Secondary | ICD-10-CM | POA: Diagnosis not present

## 2022-04-10 DIAGNOSIS — E7849 Other hyperlipidemia: Secondary | ICD-10-CM | POA: Diagnosis not present

## 2022-04-10 DIAGNOSIS — I10 Essential (primary) hypertension: Secondary | ICD-10-CM | POA: Diagnosis not present

## 2022-04-24 DIAGNOSIS — I10 Essential (primary) hypertension: Secondary | ICD-10-CM | POA: Diagnosis not present

## 2022-04-24 DIAGNOSIS — E119 Type 2 diabetes mellitus without complications: Secondary | ICD-10-CM | POA: Diagnosis not present

## 2022-05-29 DIAGNOSIS — E119 Type 2 diabetes mellitus without complications: Secondary | ICD-10-CM | POA: Diagnosis not present

## 2022-05-29 DIAGNOSIS — M6580 Other synovitis and tenosynovitis, unspecified site: Secondary | ICD-10-CM | POA: Diagnosis not present

## 2022-06-05 DIAGNOSIS — J069 Acute upper respiratory infection, unspecified: Secondary | ICD-10-CM | POA: Diagnosis not present

## 2022-08-24 DIAGNOSIS — H524 Presbyopia: Secondary | ICD-10-CM | POA: Diagnosis not present

## 2022-08-24 DIAGNOSIS — E119 Type 2 diabetes mellitus without complications: Secondary | ICD-10-CM | POA: Diagnosis not present

## 2022-08-28 DIAGNOSIS — I10 Essential (primary) hypertension: Secondary | ICD-10-CM | POA: Diagnosis not present

## 2022-08-28 DIAGNOSIS — M6580 Other synovitis and tenosynovitis, unspecified site: Secondary | ICD-10-CM | POA: Diagnosis not present

## 2022-08-28 DIAGNOSIS — E119 Type 2 diabetes mellitus without complications: Secondary | ICD-10-CM | POA: Diagnosis not present

## 2022-08-28 DIAGNOSIS — J302 Other seasonal allergic rhinitis: Secondary | ICD-10-CM | POA: Diagnosis not present

## 2022-11-27 DIAGNOSIS — K30 Functional dyspepsia: Secondary | ICD-10-CM | POA: Diagnosis not present

## 2022-11-27 DIAGNOSIS — I10 Essential (primary) hypertension: Secondary | ICD-10-CM | POA: Diagnosis not present

## 2022-11-27 DIAGNOSIS — J302 Other seasonal allergic rhinitis: Secondary | ICD-10-CM | POA: Diagnosis not present

## 2022-11-27 DIAGNOSIS — E119 Type 2 diabetes mellitus without complications: Secondary | ICD-10-CM | POA: Diagnosis not present

## 2022-12-16 DIAGNOSIS — E119 Type 2 diabetes mellitus without complications: Secondary | ICD-10-CM | POA: Diagnosis not present

## 2022-12-16 DIAGNOSIS — I10 Essential (primary) hypertension: Secondary | ICD-10-CM | POA: Diagnosis not present

## 2022-12-16 DIAGNOSIS — K219 Gastro-esophageal reflux disease without esophagitis: Secondary | ICD-10-CM | POA: Diagnosis not present

## 2023-02-02 DIAGNOSIS — R051 Acute cough: Secondary | ICD-10-CM | POA: Diagnosis not present

## 2023-02-02 DIAGNOSIS — J209 Acute bronchitis, unspecified: Secondary | ICD-10-CM | POA: Diagnosis not present

## 2023-02-17 DIAGNOSIS — E119 Type 2 diabetes mellitus without complications: Secondary | ICD-10-CM | POA: Diagnosis not present

## 2023-02-17 DIAGNOSIS — I10 Essential (primary) hypertension: Secondary | ICD-10-CM | POA: Diagnosis not present

## 2023-02-17 DIAGNOSIS — J069 Acute upper respiratory infection, unspecified: Secondary | ICD-10-CM | POA: Diagnosis not present

## 2023-02-17 DIAGNOSIS — J302 Other seasonal allergic rhinitis: Secondary | ICD-10-CM | POA: Diagnosis not present

## 2023-02-17 DIAGNOSIS — J101 Influenza due to other identified influenza virus with other respiratory manifestations: Secondary | ICD-10-CM | POA: Diagnosis not present

## 2023-02-17 DIAGNOSIS — Z20828 Contact with and (suspected) exposure to other viral communicable diseases: Secondary | ICD-10-CM | POA: Diagnosis not present

## 2023-03-03 DIAGNOSIS — J302 Other seasonal allergic rhinitis: Secondary | ICD-10-CM | POA: Diagnosis not present

## 2023-03-03 DIAGNOSIS — U071 COVID-19: Secondary | ICD-10-CM | POA: Diagnosis not present

## 2023-03-15 DIAGNOSIS — R509 Fever, unspecified: Secondary | ICD-10-CM | POA: Diagnosis not present

## 2023-03-15 DIAGNOSIS — R051 Acute cough: Secondary | ICD-10-CM | POA: Diagnosis not present

## 2023-03-15 DIAGNOSIS — R519 Headache, unspecified: Secondary | ICD-10-CM | POA: Diagnosis not present

## 2023-03-15 DIAGNOSIS — R0981 Nasal congestion: Secondary | ICD-10-CM | POA: Diagnosis not present

## 2023-03-15 DIAGNOSIS — J101 Influenza due to other identified influenza virus with other respiratory manifestations: Secondary | ICD-10-CM | POA: Diagnosis not present

## 2023-08-20 DIAGNOSIS — M899 Disorder of bone, unspecified: Secondary | ICD-10-CM | POA: Diagnosis not present

## 2023-08-20 DIAGNOSIS — I119 Hypertensive heart disease without heart failure: Secondary | ICD-10-CM | POA: Diagnosis not present

## 2023-08-20 DIAGNOSIS — H524 Presbyopia: Secondary | ICD-10-CM | POA: Diagnosis not present

## 2023-08-20 DIAGNOSIS — B353 Tinea pedis: Secondary | ICD-10-CM | POA: Diagnosis not present

## 2023-08-20 DIAGNOSIS — E119 Type 2 diabetes mellitus without complications: Secondary | ICD-10-CM | POA: Diagnosis not present

## 2023-08-20 DIAGNOSIS — I10 Essential (primary) hypertension: Secondary | ICD-10-CM | POA: Diagnosis not present

## 2023-08-20 DIAGNOSIS — M2550 Pain in unspecified joint: Secondary | ICD-10-CM | POA: Diagnosis not present

## 2023-08-20 DIAGNOSIS — Z79899 Other long term (current) drug therapy: Secondary | ICD-10-CM | POA: Diagnosis not present

## 2023-10-12 ENCOUNTER — Ambulatory Visit (INDEPENDENT_AMBULATORY_CARE_PROVIDER_SITE_OTHER): Admitting: Podiatry

## 2023-10-12 ENCOUNTER — Ambulatory Visit (INDEPENDENT_AMBULATORY_CARE_PROVIDER_SITE_OTHER)

## 2023-10-12 ENCOUNTER — Encounter: Payer: Self-pay | Admitting: Podiatry

## 2023-10-12 DIAGNOSIS — M7750 Other enthesopathy of unspecified foot: Secondary | ICD-10-CM

## 2023-10-12 DIAGNOSIS — M7752 Other enthesopathy of left foot: Secondary | ICD-10-CM | POA: Diagnosis not present

## 2023-10-12 DIAGNOSIS — M7751 Other enthesopathy of right foot: Secondary | ICD-10-CM

## 2023-10-12 MED ORDER — TERBINAFINE HCL 250 MG PO TABS: 250.0000 mg | ORAL_TABLET | Freq: Every day | ORAL | 0 refills | Status: AC

## 2023-10-14 NOTE — Progress Notes (Signed)
 Subjective:   Patient ID: Erik Grimes, male   DOB: 63 y.o.   MRN: [id number]   HPI Patient presents stating that he has a lot of irritation between the 3rd and 4th toes of the left foot and he has trouble at times wearing shoes and then has irritation between adjacent digit and has been put on topical medicines previously with minimal response.  Patient does not smoke likes to be active   Review of Systems  All other systems reviewed and are negative.       Objective:  Physical Exam Vitals and nursing note reviewed.  Constitutional:      Appearance: He is well-developed.  Pulmonary:     Effort: Pulmonary effort is normal.  Musculoskeletal:        General: Normal range of motion.  Skin:    General: Skin is warm.  Neurological:     Mental Status: He is alert.     Neurovascular status intact muscle strength found to be adequate range of motion was within normal limits.  Patient is noted to have exquisite irritation of tissue with keratotic lesions on the interspace 3rd and 4th digits with the possibility for bony involvement along with fungal infection which may be a part of the pathology this patient is experiencing.     Assessment:  Difficult condition as it may be related to bone structure versus soft tissue pathology     Plan:  H&P reviewed discussed at great length and at this point we going to try oral antifungal agent to see if we can reduce fungal count and then possibility for other treatment and ultimately some form of arthroplasty or other treatment may need to be done.  Patient will be seen back in about 4 weeks to reevaluate with today 30 days of oral Lamisil  prescribed with instructions on 1 a day usage  X-rays do indicate bone structure is slightly compressed in this area with no other pathology noted

## 2023-11-12 ENCOUNTER — Other Ambulatory Visit: Payer: Self-pay | Admitting: Podiatry

## 2023-11-20 NOTE — Telephone Encounter (Signed)
 Only wanted to use 30

## 2023-12-03 DIAGNOSIS — B353 Tinea pedis: Secondary | ICD-10-CM | POA: Diagnosis not present

## 2023-12-03 DIAGNOSIS — E1165 Type 2 diabetes mellitus with hyperglycemia: Secondary | ICD-10-CM | POA: Diagnosis not present

## 2023-12-03 DIAGNOSIS — M2550 Pain in unspecified joint: Secondary | ICD-10-CM | POA: Diagnosis not present

## 2023-12-03 DIAGNOSIS — I10 Essential (primary) hypertension: Secondary | ICD-10-CM | POA: Diagnosis not present
